# Patient Record
Sex: Female | Born: 2000 | Hispanic: Yes | Marital: Single | Smoking: Never smoker
Health system: Southern US, Community
[De-identification: ages and names within clinical notes are randomized; demographics above are authoritative.]

## PROBLEM LIST (undated history)

## (undated) DIAGNOSIS — J45909 Unspecified asthma, uncomplicated: Secondary | ICD-10-CM

## (undated) DIAGNOSIS — L732 Hidradenitis suppurativa: Secondary | ICD-10-CM

## (undated) HISTORY — PX: NO PAST SURGERIES: SHX2092

---
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
## 2019-02-12 ENCOUNTER — Emergency Department: Payer: Medicaid Other

## 2019-02-12 ENCOUNTER — Other Ambulatory Visit: Payer: Self-pay

## 2019-02-12 ENCOUNTER — Emergency Department
Admission: EM | Admit: 2019-02-12 | Discharge: 2019-02-12 | Disposition: A | Discharge status: Home / Self Care | Payer: Medicaid Other | Attending: Emergency Medicine | Admitting: Emergency Medicine

## 2019-02-12 DIAGNOSIS — R0789 Other chest pain: Secondary | ICD-10-CM | POA: Diagnosis not present

## 2019-02-12 DIAGNOSIS — Z79899 Other long term (current) drug therapy: Secondary | ICD-10-CM | POA: Insufficient documentation

## 2019-02-12 DIAGNOSIS — J45909 Unspecified asthma, uncomplicated: Secondary | ICD-10-CM | POA: Diagnosis not present

## 2019-02-12 LAB — CBC
HCT: 40.7 % (ref 36.0–46.0)
Hemoglobin: 13.9 g/dL (ref 12.0–15.0)
MCH: 28.4 pg (ref 26.0–34.0)
MCHC: 34.2 g/dL (ref 30.0–36.0)
MCV: 83.2 fL (ref 80.0–100.0)
Platelets: 326 10*3/uL (ref 150–400)
RBC: 4.89 MIL/uL (ref 3.87–5.11)
RDW: 12.8 % (ref 11.5–15.5)
WBC: 13 10*3/uL — ABNORMAL HIGH (ref 4.0–10.5)
nRBC: 0 % (ref 0.0–0.2)

## 2019-02-12 LAB — BASIC METABOLIC PANEL
Anion gap: 11 (ref 5–15)
BUN: 12 mg/dL (ref 6–20)
CO2: 23 mmol/L (ref 22–32)
Calcium: 9 mg/dL (ref 8.9–10.3)
Chloride: 104 mmol/L (ref 98–111)
Creatinine, Ser: 0.44 mg/dL (ref 0.44–1.00)
GFR calc Af Amer: >60 mL/min (ref >60)
GFR calc non Af Amer: >60 mL/min (ref >60)
Glucose, Bld: 110 mg/dL — ABNORMAL HIGH (ref 70–99)
Potassium: 3.5 mmol/L (ref 3.5–5.1)
Sodium: 138 mmol/L (ref 135–145)

## 2019-02-12 LAB — TROPONIN I (HIGH SENSITIVITY): Troponin I (High Sensitivity): 3 ng/L (ref <18)

## 2019-02-12 NOTE — Discharge Instructions (Signed)
Return to the ER for new, worsening, or persistent severe chest pain, difficulty breathing, fever, weakness, or any other new or worsening symptoms that concern you.  You should take ibuprofen (Advil, Motrin) 400 or 600 mg every 6 hours for the next several days with food.

## 2019-02-12 NOTE — ED Provider Notes (Signed)
[facility] Emergency Department Provider Note ____________________________________________   First MD Initiated Contact with Patient 02/12/19 [phone]     (approximate)  I have reviewed the triage vital signs and the nursing notes.   HISTORY  Chief Complaint Chest Pain and Headache    HPI Angela Middleton is a 19 y.o. female with PMH as noted below who presents with chest pain, gradual onset over the last week, and more severe in intensity since yesterday.  The patient states that it was worse after she was lifting heavy boxes at work.  The pain is described as pressure-like and it is substernal and nonradiating.  She states it feels better when she scrunches her upper body forward, and worse when she moves her shoulders back.  She denies associated lightheadedness, shortness of breath, or vomiting, although she does report some intermittent nausea in the last few days.  She has had no cough or fever.  She has no leg swelling.  She denies any prior history of this pain.  Past Medical History:  Diagnosis Date  . Asthma     There are no problems to display for this patient.   No past surgical history on file.  Prior to Admission medications   Medication Sig Start Date End Date Taking? Authorizing Provider  albuterol (VENTOLIN HFA) 108 (90 Base) MCG/ACT inhaler Inhale 2 puffs into the lungs every 6 (six) hours as needed for wheezing or shortness of breath. 06/22/18   Coral Spikes, DO  benzonatate (TESSALON) 200 MG capsule Take 1 capsule (200 mg total) by mouth 3 (three) times daily as needed for cough. 06/22/18   Coral Spikes, DO  cetirizine (ZYRTEC) 10 MG tablet Take 10 mg by mouth daily.    [provider]  fluticasone (FLONASE) 50 MCG/ACT nasal spray Place 2 sprays into both nostrils daily. 02/10/18   Lorin Picket, PA-C  predniSONE (DELTASONE) 50 MG tablet 1 tablet daily x 5 days 06/22/18   Coral Spikes, DO    Allergies Patient has no known  allergies.  Family History  Problem Relation Age of Onset  . Asthma Mother   . Healthy Mother   . Diabetes Father   . Healthy Father     Social History Social History   Tobacco Use  . Smoking status: Never Smoker  . Smokeless tobacco: Never Used  Substance Use Topics  . Alcohol use: No  . Drug use: Never    Review of Systems  Constitutional: No fever. Eyes: No redness. ENT: No sore throat. Cardiovascular: Positive for chest pain. Respiratory: Denies shortness of breath. Gastrointestinal: No vomiting or diarrhea.  Genitourinary: Negative for flank pain.  Musculoskeletal: Negative for back pain. Skin: Negative for rash. Neurological: Positive for headache.   ____________________________________________   PHYSICAL EXAM:  VITAL SIGNS: ED Triage Vitals  Enc Vitals Group     BP 02/12/19 0117 130/83     Pulse Rate 02/12/19 0117 96     Resp 02/12/19 0117 18     Temp 02/12/19 0117 98.8 F (37.1 C)     Temp Source 02/12/19 0117 Oral     SpO2 02/12/19 0117 98 %     Weight 02/12/19 0116 185 lb (83.9 kg)     Height 02/12/19 0116 5\' 2"  (1.575 m)     Head Circumference --      Peak Flow --      Pain Score 02/12/19 0116 7     Pain Loc --  Pain Edu? --      Excl. in [place]? --     Constitutional: Alert and oriented. Well appearing and in no acute distress. Eyes: Conjunctivae are normal.  Head: Atraumatic. Nose: No congestion/rhinnorhea. Mouth/Throat: Mucous membranes are moist.   Neck: Normal range of motion.  Cardiovascular: Normal rate, regular rhythm.   Good peripheral circulation. Respiratory: Normal respiratory effort.  No retractions.  Gastrointestinal:  No distention.  Musculoskeletal: No lower extremity edema.  No calf or popliteal swelling.  Extremities warm and well perfused.  Neurologic:  Normal speech and language. No gross focal neurologic deficits are appreciated.  Skin:  Skin is warm and dry. No rash noted. Psychiatric: Mood and affect are normal.  Speech and behavior are normal.  ____________________________________________   LABS (all labs ordered are listed, but only abnormal results are displayed)  Labs Reviewed  BASIC METABOLIC PANEL - Abnormal; Notable for the following components:      Result Value   Glucose, Bld 110 (*)    All other components within normal limits  CBC - Abnormal; Notable for the following components:   WBC 13.0 (*)    All other components within normal limits  POC URINE PREG, ED  TROPONIN I (HIGH SENSITIVITY)  TROPONIN I (HIGH SENSITIVITY)   ____________________________________________  EKG  ED ECG REPORT I, Arta Silence, the attending physician, personally viewed and interpreted this ECG.  Date: 02/12/2019 EKG Time: 0122 Rate: 98 Rhythm: normal sinus rhythm QRS Axis: normal Intervals: normal ST/T Wave abnormalities: normal Narrative Interpretation: no evidence of acute ischemia  ____________________________________________  RADIOLOGY  CXR: No focal infiltrate or other acute abnormality  ____________________________________________   PROCEDURES  Procedure(s) performed: No  Procedures  Critical Care performed: No ____________________________________________   INITIAL IMPRESSION / ASSESSMENT AND PLAN / ED COURSE  Pertinent labs & imaging results that were available during my care of the patient were reviewed by me and considered in my medical decision making (see chart for details).  19 year old female with a history of asthma presents with atypical and nonexertional chest pain over the last week which she states is worse with certain positions and after she was lifting boxes.  She reports some nausea and headache but denies associated shortness of breath or lightheadedness.  She has no leg pain or swelling.  She is not on birth control.  On exam, the patient is well-appearing.  Her vital signs are normal.  The physical exam is unremarkable.  EKG shows no  abnormalities.  Overall presentation is consistent with costochondritis or other musculoskeletal chest wall pain given the positional nature, the worsening of the pain after lifting boxes, and the lack of other significant associated symptoms.  The patient has no risk factors for ACS.  She is PERC negative.  There is no evidence of other vascular etiology.  Lab work-up was obtained from triage and is within normal limits.  The troponin is negative.  Given the duration of the pain as well as the patient's very low risk for ACS, there is no indication for repeat troponin.  At this time, the patient is stable for discharge home.  I counseled her on the results of the work-up and the plan of care.  I advised her to take ibuprofen, and she states she would like to obtain this over-the-counter.  I gave her thorough return precautions and she expressed understanding.  ____________________________________________   FINAL CLINICAL IMPRESSION(S) / ED DIAGNOSES  Final diagnoses:  Atypical chest pain      NEW MEDICATIONS  STARTED DURING THIS VISIT:  New Prescriptions   No medications on file     Note:  This document was prepared using Dragon voice recognition software and may include unintentional dictation errors.   Arta Silence, MD 02/12/19 [phone]

## 2019-02-12 NOTE — ED Notes (Signed)
Pt verbalized understanding of discharge instructions. NAD at this time. 

## 2019-02-12 NOTE — ED Triage Notes (Signed)
Patient reports mid sternal chest pain that does not radiate, described as pressure.   Patient also reports headache all day.

## 2019-08-15 ENCOUNTER — Other Ambulatory Visit: Payer: Self-pay

## 2019-08-15 ENCOUNTER — Encounter: Payer: Self-pay | Admitting: Advanced Practice Midwife

## 2019-08-15 ENCOUNTER — Ambulatory Visit (LOCAL_COMMUNITY_HEALTH_CENTER): Payer: Medicaid Other | Admitting: Advanced Practice Midwife

## 2019-08-15 VITALS — BP 122/64 | Ht 62.0 in | Wt 187.4 lb

## 2019-08-15 DIAGNOSIS — Z3009 Encounter for other general counseling and advice on contraception: Secondary | ICD-10-CM | POA: Diagnosis not present

## 2019-08-15 DIAGNOSIS — Z3200 Encounter for pregnancy test, result unknown: Secondary | ICD-10-CM | POA: Diagnosis not present

## 2019-08-15 LAB — PREGNANCY, URINE: Preg Test, Ur: POSITIVE — AB

## 2019-08-15 MED ORDER — PRENATAL VITAMINS 28-0.8 MG PO TABS: 1.0000 | ORAL_TABLET | Freq: Every day | ORAL | 0 refills | Status: AC

## 2019-08-15 NOTE — Progress Notes (Signed)
+  PT today.  Not seen by provider but RN counseled on pregnancy

## 2019-08-15 NOTE — Progress Notes (Addendum)
PT positive today. Did not see a provider due to +PT. Patient sent up to clerical to schedule new OB appt. Accepted PNV's and +PT packet. Hal Morales, RN

## 2019-08-15 NOTE — Progress Notes (Signed)
Here today for PE and to restart Depo. New patient here. No PE or Pap records. Patient states "she needs a pregnancy test because she took a home pregnancy test and it was positive." RN consulted with E. Sciora, CNM. Patient sent to lab for PT. Hal Morales, RN

## 2019-08-17 ENCOUNTER — Ambulatory Visit
Admission: EM | Admit: 2019-08-17 | Discharge: 2019-08-17 | Disposition: A | Discharge status: Home / Self Care | Payer: Medicaid Other | Attending: Internal Medicine | Admitting: Internal Medicine

## 2019-08-17 ENCOUNTER — Other Ambulatory Visit: Payer: Self-pay

## 2019-08-17 DIAGNOSIS — J020 Streptococcal pharyngitis: Secondary | ICD-10-CM | POA: Insufficient documentation

## 2019-08-17 DIAGNOSIS — O209 Hemorrhage in early pregnancy, unspecified: Secondary | ICD-10-CM | POA: Diagnosis not present

## 2019-08-17 LAB — CHLAMYDIA/NGC RT PCR (ARMC ONLY)
Chlamydia Tr: DETECTED — AB
N gonorrhoeae: NOT DETECTED

## 2019-08-17 LAB — WET PREP, GENITAL
Sperm: NONE SEEN
Trich, Wet Prep: NONE SEEN

## 2019-08-17 LAB — GROUP A STREP BY PCR: Group A Strep by PCR: DETECTED — AB

## 2019-08-17 MED ORDER — AMOXICILLIN 500 MG PO CAPS: 500.0000 mg | ORAL_CAPSULE | Freq: Two times a day (BID) | ORAL | 0 refills | Status: AC

## 2019-08-17 NOTE — ED Provider Notes (Addendum)
[facility]    CSN: [id number] Arrival date & time: 08/17/19  1726      History   Chief Complaint Chief Complaint  Patient presents with  . Sore Throat  . Vaginal Problem    HPI Angela Middleton is a 19 y.o. female currently 6 to [redacted] weeks pregnant comes to the urgent care with painful swelling in the left labia.  Patient says swelling started around July 1.  She initially applied some ointment to it and seemed to have helped.  Over the past couple of days she has noticed increased pain and swelling.  No discharge from it.  Pain is throbbing and of moderate severity.  No fever or chills.  Patient also complains about vaginal spotting with intermittent abdominal pain.  Currently she denies any abdominal pain.  When she has pain is mainly in the lower abdominal region and is crampy in nature.  2 days ago patient started experiencing throat pain.  Pain is currently severe.  Is aggravated by swallowing.  No known relieving factors.  No ear pain or discharge.  No nausea or vomiting.   HPI  Past Medical History:  Diagnosis Date  . Asthma     There are no problems to display for this patient.   History reviewed. No pertinent surgical history.  OB History    Gravida  1   Para      Term      Preterm      AB      Living        SAB      TAB      Ectopic      Multiple      Live Births               Home Medications    Prior to Admission medications   Medication Sig Start Date End Date Taking? Authorizing Provider  albuterol (VENTOLIN HFA) 108 (90 Base) MCG/ACT inhaler Inhale 2 puffs into the lungs every 6 (six) hours as needed for wheezing or shortness of breath. 06/22/18   Coral Spikes, DO  amoxicillin (AMOXIL) 500 MG capsule Take 1 capsule (500 mg total) by mouth 2 (two) times daily for 10 days. 08/17/19 08/27/19  Chase Picket, MD  cetirizine (ZYRTEC) 10 MG tablet Take 10 mg by mouth daily.    [provider]  fluticasone (FLONASE) 50  MCG/ACT nasal spray Place 2 sprays into both nostrils daily. Patient not taking: Reported on 08/15/2019 02/10/18   Lorin Picket, PA-C  Prenatal Vit-Fe Fumarate-FA (PRENATAL VITAMINS) 28-0.8 MG TABS Take 1 tablet by mouth daily for 100 doses. 08/15/19 11/23/19  Herbie Saxon, CNM    Family History Family History  Problem Relation Age of Onset  . Asthma Mother   . Healthy Mother   . Diabetes Father   . Healthy Father     Social History Social History   Tobacco Use  . Smoking status: Former Smoker    Types: Cigarettes  . Smokeless tobacco: Former Systems developer  . Tobacco comment: Vapes occasionally  Substance Use Topics  . Alcohol use: Not Currently  . Drug use: Never     Allergies   Patient has no known allergies.   Review of Systems Review of Systems  Constitutional: Negative for activity change, chills and fatigue.  HENT: Positive for sore throat. Negative for congestion, ear discharge, ear pain and mouth sores.   Eyes: Negative for pain, discharge and itching.  Respiratory: Negative  for cough, chest tightness and shortness of breath.   Cardiovascular: Negative.   Gastrointestinal: Negative for abdominal pain, nausea and vomiting.  Genitourinary: Positive for vaginal pain. Negative for dysuria, frequency, urgency and vaginal discharge.     Physical Exam Triage Vital Signs ED Triage Vitals  Enc Vitals Group     BP 08/17/19 1740 116/73     Pulse Rate 08/17/19 1740 (!) 105     Resp 08/17/19 1740 16     Temp 08/17/19 1740 98.8 F (37.1 C)     Temp Source 08/17/19 1740 Oral     SpO2 08/17/19 1740 100 %     Weight 08/17/19 1741 187 lb (84.8 kg)     Height 08/17/19 1741 5\' 2"  (1.575 m)     Head Circumference --      Peak Flow --      Pain Score 08/17/19 1739 7     Pain Loc --      Pain Edu? --      Excl. in [place]? --    No data found.  Updated Vital Signs BP 116/73 (BP Location: Left Arm)   Pulse (!) 105   Temp 98.8 F (37.1 C) (Oral)   Resp 16   Ht 5\' 2"   (1.575 m)   Wt 84.8 kg   LMP 07/07/2019   SpO2 100%   BMI 34.20 kg/m   Visual Acuity Right Eye Distance:   Left Eye Distance:   Bilateral Distance:    Right Eye Near:   Left Eye Near:    Bilateral Near:     Physical Exam Vitals and nursing note reviewed.  Constitutional:      Appearance: She is well-developed.  HENT:     Right Ear: Tympanic membrane normal. No drainage or swelling.     Left Ear: Tympanic membrane normal. No drainage or swelling.     Nose: No congestion.     Mouth/Throat:     Mouth: Mucous membranes are moist. No oral lesions.     Pharynx: Oropharyngeal exudate present. No pharyngeal swelling or uvula swelling.     Tonsils: Tonsillar exudate present. 1+ on the right. 1+ on the left.  Eyes:     Extraocular Movements:     Right eye: Normal extraocular motion.     Left eye: Normal extraocular motion.  Neck:     Thyroid: No thyromegaly.  Cardiovascular:     Rate and Rhythm: Normal rate and regular rhythm.  Pulmonary:     Effort: Pulmonary effort is normal. No respiratory distress.     Breath sounds: Normal breath sounds. No wheezing or rhonchi.  Abdominal:     General: Bowel sounds are normal. There is no distension.     Palpations: Abdomen is soft.     Tenderness: There is no abdominal tenderness. There is no guarding.  Musculoskeletal:     Cervical back: Normal range of motion.  Lymphadenopathy:     Cervical: No cervical adenopathy.  Neurological:     Mental Status: She is alert.      UC Treatments / Results  Labs (all labs ordered are listed, but only abnormal results are displayed) Labs Reviewed  GROUP A STREP BY PCR - Abnormal; Notable for the following components:      Result Value   Group A Strep by PCR DETECTED (*)    All other components within normal limits  CHLAMYDIA/NGC RT PCR ([facility] ONLY)  WET PREP, GENITAL    EKG   Radiology No results  found.  Procedures Procedures (including critical care time)  Medications  Ordered in UC Medications - No data to display  Initial Impression / Assessment and Plan / UC Course  I have reviewed the triage vital signs and the nursing notes.  Pertinent labs & imaging results that were available during my care of the patient were reviewed by me and considered in my medical decision making (see chart for details).     1.  Acute streptococcal pharyngitis: Strep PCR is positive Warm salt water gargle Amoxicillin 500 mg twice daily for 10 days If patient experiences worsening symptoms she is advised to return to the urgent care  2.  Vaginal bleeding in pregnancy: Patient will follow up with [facility] tomorrow to be evaluated STD screen-GC/chlamydia Wet prep  3.  Furunculosis in the vulvar area: No drainable abscess Sitz baths No surrounding erythema If patient experiences worsening symptoms she is advised to return to the urgent care to be reevaluated.  Final Clinical Impressions(s) / UC Diagnoses   Final diagnoses:  Strep pharyngitis  Vaginal bleeding in pregnancy, first trimester   Discharge Instructions   None    ED Prescriptions    Medication Sig Dispense Auth. Provider   amoxicillin (AMOXIL) 500 MG capsule Take 1 capsule (500 mg total) by mouth 2 (two) times daily for 10 days. 20 capsule Jeferson Boozer, Myrene Galas, MD     PDMP not reviewed this encounter.   Chase Picket, MD 08/17/19 1919    Chase Picket, MD 08/17/19 Lurena Nida

## 2019-08-17 NOTE — ED Triage Notes (Addendum)
Pt states she noticed a bump near her vaginal opening around July 1 and started using ointment. Seemed to be getting better, but now has gotten bigger again. Also has a sore throat starting yesterday but much worse today. Admits to unprotected sex and also just found out she is pregnant. Reports she is 6-weeks pregnant.

## 2019-08-18 ENCOUNTER — Telehealth: Payer: Self-pay

## 2019-08-18 DIAGNOSIS — A749 Chlamydial infection, unspecified: Secondary | ICD-10-CM

## 2019-08-18 DIAGNOSIS — B3731 Acute candidiasis of vulva and vagina: Secondary | ICD-10-CM

## 2019-08-18 MED ORDER — AZITHROMYCIN 250 MG PO TABS: 250.0000 mg | ORAL_TABLET | Freq: Every day | ORAL | 0 refills | Status: DC

## 2019-08-18 MED ORDER — AZITHROMYCIN 250 MG PO TABS: ORAL_TABLET | ORAL | 0 refills | Status: DC

## 2019-08-18 MED ORDER — TERCONAZOLE 0.4 % VA CREA: 1.0000 | TOPICAL_CREAM | Freq: Every day | VAGINAL | 2 refills | Status: DC

## 2019-09-19 ENCOUNTER — Ambulatory Visit: Payer: Medicaid Other

## 2019-09-19 ENCOUNTER — Other Ambulatory Visit: Payer: Self-pay

## 2019-09-19 ENCOUNTER — Ambulatory Visit (LOCAL_COMMUNITY_HEALTH_CENTER): Payer: Medicaid Other | Admitting: Family Medicine

## 2019-09-19 ENCOUNTER — Encounter: Payer: Self-pay | Admitting: Family Medicine

## 2019-09-19 VITALS — BP 139/80 | Ht 62.0 in | Wt 188.0 lb

## 2019-09-19 DIAGNOSIS — Z113 Encounter for screening for infections with a predominantly sexual mode of transmission: Secondary | ICD-10-CM

## 2019-09-19 DIAGNOSIS — Z3009 Encounter for other general counseling and advice on contraception: Secondary | ICD-10-CM | POA: Diagnosis not present

## 2019-09-19 LAB — PREGNANCY, URINE: Preg Test, Ur: NEGATIVE

## 2019-09-19 LAB — WET PREP FOR TRICH, YEAST, CLUE
Trichomonas Exam: NEGATIVE
Yeast Exam: NEGATIVE

## 2019-09-19 MED ORDER — THERA VITAL M PO TABS: 1.0000 | ORAL_TABLET | Freq: Every day | ORAL | 0 refills | Status: DC

## 2019-09-19 NOTE — Progress Notes (Signed)
[facility] Main Number: [phone]    Family Planning Visit- Initial Visit  Subjective:  Angela Middleton is a 19 y.o.  [id number]   being seen today for an initial well woman visit and to discuss family planning options.  She is currently using None for pregnancy prevention. Patient reports she does not want a pregnancy in the next year.  Patient has the following medical conditions does not have a problem list on file.  Chief Complaint  Patient presents with  . Contraception    Physical and birth control    Patient reports she is here for birth control and exam.  States that she had a miscarriage 08/18/2019 and was treated for chlamydia and yeast.  She and partner have unprotected sex within the week after miscarriage- States she didn't receive counseling when to resume sex.    Patient denies other concerns.  Body mass index is 34.39 kg/m. - Patient is eligible for diabetes screening based on BMI and age >99?  not applicable JT7S ordered? not applicable  Patient reports 1 of partners in last year. Desires STI screening?  Yes  Has patient been screened once for HCV in the past?  No  No results found for: HCVAB  Does the patient have current drug use (including MJ), have a partner with drug use, and/or has been incarcerated since last result? No  If yes-- Screen for HCV through [facility]   Does the patient meet criteria for HBV testing? No  Criteria:  -Household, sexual or needle sharing contact with HBV -History of drug use -HIV positive -Those with known Hep C   Health Maintenance Due  Topic Date Due  . Hepatitis C Screening  Never done  . COVID-19 Vaccine (1) Never done  . CHLAMYDIA SCREENING  Never done  . HIV Screening  Never done  . TETANUS/TDAP  Never done  . INFLUENZA VACCINE  09/10/2019    Review of Systems  Genitourinary:       Vaginal discharge- treated 08/18/2019 for chlamydia  and yeast- would like to have TOC today.  All other systems reviewed and are negative.   The following portions of the patient's history were reviewed and updated as appropriate: allergies, current medications, past family history, past medical history, past social history, past surgical history and problem list. Problem list updated.   See flowsheet for other program required questions.  Objective:   Vitals:   09/19/19 1015  BP: 139/80  Weight: 188 lb (85.3 kg)  Height: 5\' 2"  (1.575 m)    Physical Exam Vitals and nursing note reviewed.  Constitutional:      Appearance: Normal appearance.  HENT:     Head: Normocephalic.  Pulmonary:     Effort: Pulmonary effort is normal.  Genitourinary:    General: Normal vulva.     Exam position: Lithotomy position.     Pubic Area: No rash.      Labia:        Right: No rash, tenderness, lesion or injury.        Left: No rash, tenderness, lesion or injury.      Vagina: No vaginal discharge.     Cervix: Normal.     Uterus: Normal.      Adnexa: Right adnexa normal and left adnexa normal.     Rectum: Normal.  Musculoskeletal:        General: Normal range of motion.  Lymphadenopathy:  Lower Body: No right inguinal adenopathy. No left inguinal adenopathy.  Skin:    General: Skin is warm and dry.  Neurological:     Mental Status: She is alert. Mental status is at baseline.       Assessment and Plan:  Angela Middleton is a 19 y.o. female presenting to the [facility] Department for an initial well woman exam/family planning visit  Contraception counseling: Reviewed all forms of birth control options in the tiered based approach. available including abstinence; over the counter/barrier methods; hormonal contraceptive medication including pill, patch, ring, injection,contraceptive implant, ECP; hormonal and nonhormonal IUDs; permanent sterilization options including vasectomy and the various tubal sterilization modalities.  Risks, benefits, and typical effectiveness rates were reviewed.  Questions were answered.  Written information was also given to the patient to review.  Patient desires to use condoms x 2 weeks, this was prescribed for patient. She will follow up in 2 weeks for PT and OCPs if PT negative for surveillance.  She was told to call with any further questions, or with any concerns about this method of contraception.  Emphasized use of condoms 100% of the time for STI prevention.  Patient wasn't an ECP candidate  1. Family planning - Counseled client that the PT today wouldn't give testing for the recent 14 days that she and partner had unprotected sex. Consulted with Dr. Ernestina Patches-  Discussed with client that if PT wasvnegative today client could start either the Depo, take Micronor or use condoms until she came back for her PT in 2 weeks.  IF negative and no unprotected sex then OCP's would be prescribed if she desired to change to this method.  Client chose to use condoms for 2 weeks then return for PT and OCP's.  - Pregnancy, urine - Multiple Vitamins-Minerals (MULTIVITAMIN) tablet;  Take 1 tablet by mouth daily.  Dispense: 100 tablet; Refill: 0  2. Screening examination for venereal disease  - WET PREP FOR [place], YEAST, CLUE - Chlamydia/Gonorrhea [place] [facility] - HIV [place] [facility] - Syphilis Serology, [place] [facility]     Return in 17 days (on 10/06/2019) for UPT and start birth control pills, 2-3 weeks for TR's, annual and PRN.  Future Appointments  Date Time Provider [place]  10/06/2019 10:40 AM [facility] PROVIDER [facility] None    Hassell Done, FNP

## 2019-09-19 NOTE — Progress Notes (Signed)
UPT is negative today. Wet mount reviewed and is negative today. Pt received MVI's per pt request and per standing order. Pt also received condoms per pt request. Per Hassell Done, FNP pt plans to use condoms 100% of the time for the next 2 weeks and then RTC for a UPT and birth control pills. Pt states she does plan to use condoms 100% of the time and pt scheduled for UPT and to start birth control pills 10/06/2019 at 10:40am per pt request and per provider verbal order. Pt aware of appt date and time and to arrive early for check-in and appt card given to pt. Provider orders completed.

## 2019-09-19 NOTE — Progress Notes (Signed)
Pt here for physical and birth control. Pt's LMP was 07/07/2019. Pt reports that she had a miscarriage ~08/18/2019. Pt reports she went to an OBGYN on [address] 08/18/2019 where they did an ultrasound. Pt reports she had bleeding for about 4 days. Pt reports last sex was 09/18/2019 without condom. Within the last 2 weeks pt reports that she and partner has had sex without condom multiple times. Pt reports she is having concerns about pain and some bleeding after sex. Pt reports was treated for Chlamydia 08/2019. Pt desires STD screening.

## 2019-10-06 ENCOUNTER — Ambulatory Visit: Payer: Self-pay

## 2019-12-14 ENCOUNTER — Encounter: Payer: Self-pay | Admitting: Emergency Medicine

## 2019-12-14 ENCOUNTER — Other Ambulatory Visit: Payer: Self-pay

## 2019-12-14 ENCOUNTER — Ambulatory Visit
Admission: EM | Admit: 2019-12-14 | Discharge: 2019-12-14 | Disposition: A | Discharge status: Home / Self Care | Payer: Medicaid Other | Attending: Physician Assistant | Admitting: Physician Assistant

## 2019-12-14 DIAGNOSIS — R102 Pelvic and perineal pain: Secondary | ICD-10-CM | POA: Diagnosis not present

## 2019-12-14 DIAGNOSIS — L708 Other acne: Secondary | ICD-10-CM | POA: Insufficient documentation

## 2019-12-14 LAB — URINALYSIS, COMPLETE (UACMP) WITH MICROSCOPIC
Bilirubin Urine: NEGATIVE
Glucose, UA: NEGATIVE mg/dL
Hgb urine dipstick: NEGATIVE
Ketones, ur: 15 mg/dL — AB
Leukocytes,Ua: NEGATIVE
Nitrite: NEGATIVE
Protein, ur: NEGATIVE mg/dL
RBC / HPF: NONE SEEN RBC/hpf (ref 0–5)
Specific Gravity, Urine: 1.025 (ref 1.005–1.030)
pH: 7 (ref 5.0–8.0)

## 2019-12-14 LAB — WET PREP, GENITAL
Clue Cells Wet Prep HPF POC: NONE SEEN
Sperm: NONE SEEN
Trich, Wet Prep: NONE SEEN
Yeast Wet Prep HPF POC: NONE SEEN

## 2019-12-14 LAB — PREGNANCY, URINE: Preg Test, Ur: NEGATIVE

## 2019-12-14 MED ORDER — BENZOYL PEROXIDE-ERYTHROMYCIN 5-3 % EX GEL
Freq: Two times a day (BID) | CUTANEOUS | 0 refills | Status: DC
Start: 2019-12-14 — End: 2019-12-20

## 2019-12-14 NOTE — ED Triage Notes (Signed)
Pt c/o vaginal pain, and discomfort. Started about a week ago. She wants std testing. Denies vaginal itching, discharge or dysuria.

## 2019-12-14 NOTE — Discharge Instructions (Addendum)
If the STD tests are negative, then use hydrocortisone cream lightly on areas of discomfort twice a day for 7 days. It re-occurs come right away to look at the area in case we need to re-culture for herpes.

## 2019-12-14 NOTE — ED Provider Notes (Signed)
[facility]    CSN: [id number] Arrival date & time: 12/14/19  1050      History   Chief Complaint Chief Complaint  Patient presents with  . Vaginal Pain    HPI Angela Middleton is a 19 y.o. female who presents with vaginal pain x 1 week. She experiences pain in the intravaginal area after sex. Has been with same partner x 6 months. Denies lesions or abnormal discharge. LMP 6 weeks ago, but takes continues OC's. Has been takingh OC's x 3 months.   2- gets black heads on the border of her lips and when she picks it turns into sores and scars up. Was sent to Derm by her PCP but they cant see her til April and her medicaid card will ran out.       Past Medical History:  Diagnosis Date  . Asthma     There are no problems to display for this patient.   History reviewed. No pertinent surgical history.  OB History    Gravida  2   Para      Term      Preterm      AB  1   Living        SAB  1   TAB      Ectopic      Multiple      Live Births               Home Medications    Prior to Admission medications   Medication Sig Start Date End Date Taking? Authorizing Provider  [place] 28 0.25-35 MG-MCG tablet Take 1 tablet by mouth daily. 10/05/19  Yes [provider]  albuterol (VENTOLIN HFA) 108 (90 Base) MCG/ACT inhaler Inhale 2 puffs into the lungs every 6 (six) hours as needed for wheezing or shortness of breath. 06/22/18   Coral Spikes, DO  benzoyl peroxide-erythromycin [facility]) gel Apply topically 2 (two) times daily. 12/14/19   Rodriguez-Southworth, Sunday Spillers, PA-C  cetirizine (ZYRTEC) 10 MG tablet Take 10 mg by mouth daily.  12/14/19  [provider]  fluticasone (FLONASE) 50 MCG/ACT nasal spray Place 2 sprays into both nostrils daily. 02/10/18 12/14/19  Lorin Picket, PA-C    Family History Family History  Problem Relation Age of Onset  . Asthma Mother   . Healthy Mother   . Diabetes Father   . Healthy Father     . Hypertension Father   . High Cholesterol Father   . Diabetes Paternal Grandfather   . Hypertension Paternal Grandfather   . Diabetes Paternal Grandmother   . Hypertension Paternal Grandmother   . Diabetes Maternal Uncle   . Hypertension Maternal Uncle     Social History Social History   Tobacco Use  . Smoking status: Former Smoker    Types: Cigarettes  . Smokeless tobacco: Former Systems developer  . Tobacco comment: no longer vaping  Vaping Use  . Vaping Use: Every day  Substance Use Topics  . Alcohol use: Not Currently  . Drug use: Never     Allergies   Shrimp [shellfish allergy]   Review of Systems Review of Systems  Constitutional: Negative for fever.  Gastrointestinal: Negative for abdominal pain.  Genitourinary: Positive for vaginal pain. Negative for difficulty urinating, dysuria, frequency, menstrual problem, pelvic pain and vaginal discharge.  Musculoskeletal: Negative for gait problem.  Skin: Negative for rash and wound.       Hx of frequent abscesses  Has black heads on border of lips  Physical Exam Triage Vital Signs ED Triage Vitals  Enc Vitals Group     BP 12/14/19 1132 106/73     Pulse Rate 12/14/19 1132 93     Resp 12/14/19 1132 18     Temp 12/14/19 1132 98.9 F (37.2 C)     Temp Source 12/14/19 1132 Oral     SpO2 12/14/19 1132 99 %     Weight 12/14/19 1128 188 lb 0.8 oz (85.3 kg)     Height 12/14/19 1128 5\' 2"  (1.575 m)     Head Circumference --      Peak Flow --      Pain Score 12/14/19 1128 5     Pain Loc --      Pain Edu? --      Excl. in [place]? --    No data found.  Updated Vital Signs BP 106/73 (BP Location: Left Arm)   Pulse 93   Temp 98.9 F (37.2 C) (Oral)   Resp 18   Ht 5\' 2"  (1.575 m)   Wt 188 lb 0.8 oz (85.3 kg)   SpO2 99%   Breastfeeding Unknown   BMI 34.40 kg/m   Visual Acuity Right Eye Distance:   Left Eye Distance:   Bilateral Distance:    Right Eye Near:   Left Eye Near:    Bilateral Near:     Physical  Exam Vitals and nursing note reviewed.  Constitutional:      General: She is not in acute distress.    Appearance: She is obese. She is not toxic-appearing.  HENT:     Head: Normocephalic.     Right Ear: External ear normal.     Left Ear: External ear normal.  Eyes:     General: No scleral icterus.    Conjunctiva/sclera: Conjunctivae normal.  Pulmonary:     Effort: Pulmonary effort is normal.  Genitourinary:    General: Normal vulva.     Comments: R inner labia minora with mild erythema, but no lesions present. Herpes culture obtained from this area that is tender.  Musculoskeletal:        General: Normal range of motion.     Cervical back: Neck supple.  Skin:    General: Skin is warm and dry.     Findings: No bruising, lesion or rash.     Comments: Skin above lips border has a few black heads and scarring on the mid upper lip is present.   Neurological:     Mental Status: She is alert and oriented to person, place, and time.     Gait: Gait normal.  Psychiatric:        Mood and Affect: Mood normal.        Behavior: Behavior normal.        Thought Content: Thought content normal.        Judgment: Judgment normal.      UC Treatments / Results  Labs (all labs ordered are listed, but only abnormal results are displayed) Labs Reviewed  WET PREP, GENITAL - Abnormal; Notable for the following components:      Result Value   WBC, Wet Prep HPF POC FEW (*)    All other components within normal limits  URINALYSIS, COMPLETE (UACMP) WITH MICROSCOPIC - Abnormal; Notable for the following components:   Ketones, ur 15 (*)    Bacteria, UA FEW (*)    All other components within normal limits  CHLAMYDIA/NGC RT PCR ([facility] ONLY)  HSV CULTURE AND TYPING  PREGNANCY, URINE    EKG   Radiology No results found.  Procedures Procedures (including critical care time)  Medications Ordered in UC Medications - No data to display  Initial Impression / Assessment and Plan / UC  Course  I have reviewed the triage vital signs and the nursing notes. Has inner vaginal irritation of unknown cause. She may have had herpes and now is healing, or just irritation from sexual friction.  20 minutes spent answering multiple questions she had about her current symptoms and hx of abscesses.  I placed her on Benzamycin gel to apply with a Q-tip on the skin where she gets the black heads bid. We will inform her when the STD tests are back.  Pertinent labs results that were available during my care of the patient were reviewed by me and considered in my medical decision making (see chart for details).    Final Clinical Impressions(s) / UC Diagnoses   Final diagnoses:  Vaginal pain  Other acne     Discharge Instructions     If the STD tests are negative, then use hydrocortisone cream lightly on areas of discomfort twice a day for 7 days. It re-occurs come right away to look at the area in case we need to re-culture for herpes.     ED Prescriptions    Medication Sig Dispense Auth. Provider   benzoyl peroxide-erythromycin (BENZAMYCIN) gel Apply topically 2 (two) times daily. 23.3 g Rodriguez-Southworth, Sunday Spillers, PA-C     PDMP not reviewed this encounter.   Shelby Mattocks, PA-C 12/14/19 1507

## 2019-12-15 LAB — CHLAMYDIA/NGC RT PCR (ARMC ONLY)
Chlamydia Tr: NOT DETECTED
N gonorrhoeae: NOT DETECTED

## 2019-12-17 LAB — HSV CULTURE AND TYPING

## 2019-12-20 ENCOUNTER — Telehealth (HOSPITAL_COMMUNITY): Payer: Self-pay | Admitting: Internal Medicine

## 2019-12-20 MED ORDER — BENZOYL PEROXIDE-ERYTHROMYCIN 5-3 % EX GEL
Freq: Two times a day (BID) | CUTANEOUS | 2 refills | Status: DC
Start: 2019-12-20 — End: 2020-03-06

## 2019-12-20 NOTE — Telephone Encounter (Signed)
As I spoke with her about her Herpes test results, since her medicaid will ran out this month, asked if I could give her refills on the Benzymycin since it is working well. So I did.

## 2020-01-08 ENCOUNTER — Ambulatory Visit
Admission: EM | Admit: 2020-01-08 | Discharge: 2020-01-08 | Disposition: A | Discharge status: Home / Self Care | Payer: Medicaid Other | Attending: Emergency Medicine | Admitting: Emergency Medicine

## 2020-01-08 ENCOUNTER — Other Ambulatory Visit: Payer: Self-pay

## 2020-01-08 DIAGNOSIS — L0291 Cutaneous abscess, unspecified: Secondary | ICD-10-CM | POA: Diagnosis not present

## 2020-01-08 MED ORDER — IBUPROFEN 600 MG PO TABS: 600.0000 mg | ORAL_TABLET | Freq: Four times a day (QID) | ORAL | 0 refills | Status: DC | PRN

## 2020-01-08 MED ORDER — DOXYCYCLINE HYCLATE 100 MG PO CAPS: 100.0000 mg | ORAL_CAPSULE | Freq: Two times a day (BID) | ORAL | 0 refills | Status: AC

## 2020-01-08 NOTE — ED Triage Notes (Signed)
Patient states that she noticed a abscess like area on her clitoris. States that she has had them on her vagina before but never on this specific area. Patient states that area worsened overnight. States that she is due to see her gyn for this 12/14 . Reports that area is extemely painful and has not drained.

## 2020-01-08 NOTE — ED Provider Notes (Signed)
HPI  SUBJECTIVE:  Angela Middleton is a 19 y.o. female who presents with left sided painful mass next to her clitoris starting yesterday.  States that it got bigger today.  She denies labial swelling, urinary complaints, rash, blisters, vaginal odor, vaginal discharge.  She denies increase in her sexual activity.  No fevers, pelvic pain.  She is in a long-term monogamous relationship with a female who is asymptomatic.  Recent STD testing was negative.  STDs are not a concern today.  She states that she had similar symptoms like this before earlier this month, but states that the pain was in a different area on this visit.  She tried Bactroban multiple times yesterday without improvement in her symptoms.  Symptoms are worse with sitting and walking.  She has a past medical history of chlamydia, yeast infections, abscesses.  No history of MRSA, diabetes, other STDs, BV, Bartholin's cyst.  PMD: Princella Ion.  OB/GYN: She is establishing care on 12/14 for this issue.  Patient  was seen here on 11/4 for similar persentation ,, right inner labia minora erythematous, no lesions were present.  Herpes culture was, wet prep, gonorrhea chlamydia negative.  Past Medical History:  Diagnosis Date  . Asthma     History reviewed. No pertinent surgical history.  Family History  Problem Relation Age of Onset  . Asthma Mother   . Healthy Mother   . Diabetes Father   . Healthy Father   . Hypertension Father   . High Cholesterol Father   . Diabetes Paternal Grandfather   . Hypertension Paternal Grandfather   . Diabetes Paternal Grandmother   . Hypertension Paternal Grandmother   . Diabetes Maternal Uncle   . Hypertension Maternal Uncle     Social History   Tobacco Use  . Smoking status: Former Smoker    Types: Cigarettes  . Smokeless tobacco: Former Network engineer  . Vaping Use: Every day  Substance Use Topics  . Alcohol use: Not Currently  . Drug use: Never    No current facility-administered  medications for this encounter.  Current Outpatient Medications:  .  albuterol (VENTOLIN HFA) 108 (90 Base) MCG/ACT inhaler, Inhale 2 puffs into the lungs every 6 (six) hours as needed for wheezing or shortness of breath., Disp: 18 g, Rfl: 3 .  benzoyl peroxide-erythromycin (BENZAMYCIN) gel, Apply topically 2 (two) times daily., Disp: 46.6 g, Rfl: 2 .  SPRINTEC 28 0.25-35 MG-MCG tablet, Take 1 tablet by mouth daily., Disp: , Rfl:  .  doxycycline (VIBRAMYCIN) 100 MG capsule, Take 1 capsule (100 mg total) by mouth 2 (two) times daily for 5 days., Disp: 10 capsule, Rfl: 0 .  ibuprofen (ADVIL) 600 MG tablet, Take 1 tablet (600 mg total) by mouth every 6 (six) hours as needed., Disp: 30 tablet, Rfl: 0  Allergies  Allergen Reactions  . Shrimp [Shellfish Allergy] Nausea And Vomiting     ROS  As noted in HPI.   Physical Exam  BP 116/81 (BP Location: Left Arm)   Pulse 100   Temp 98.5 F (36.9 C) (Oral)   Resp 18   Ht 5\' 2"  (1.575 m)   Wt 88.5 kg   SpO2 100%   BMI 35.67 kg/m   Constitutional: Well developed, well nourished, no acute distress Eyes:  EOMI, conjunctiva normal bilaterally HENT: Normocephalic, atraumatic,mucus membranes moist Respiratory: Normal inspiratory effort Cardiovascular: Normal rate GI: nondistended skin: No rash, skin intact Musculoskeletal: no deformities GU: Small tender pustule on right upper inner labia.  No other vaginal discharge.  No other vaginal lesions.  No other labial swelling.  Chaperone present during exam.    Lymph: No inguinal lymphadenopathy Neurologic: Alert & oriented x 3, no focal neuro deficits Psychiatric: Speech and behavior appropriate   ED Course   Medications - No data to display  No orders of the defined types were placed in this encounter.   No results found for this or any previous visit (from the past 24 hour(s)). No results found.  ED Clinical Impression  1. Abscess      ED Assessment/Plan  Seen here for  vaginal pain on 11/4.  As noted in HPI.  STD testing was negative on last visit.  Will not repeat as she is in a monogamous relationship.  This appears to be a pustule/very small abscess, rather than ulceration suggestive of herpes, or a lesion/wart consistent with syphilis or HPV, so will send home with doxycycline, warm compresses.  I expect this to drain on its own.  Follow-up with GYN if it gets worse.  Discussed  MDM, treatment plan, and plan for follow-up with patient.  patient agrees with plan.   Meds ordered this encounter  Medications  . doxycycline (VIBRAMYCIN) 100 MG capsule    Sig: Take 1 capsule (100 mg total) by mouth 2 (two) times daily for 5 days.    Dispense:  10 capsule    Refill:  0  . ibuprofen (ADVIL) 600 MG tablet    Sig: Take 1 tablet (600 mg total) by mouth every 6 (six) hours as needed.    Dispense:  30 tablet    Refill:  0    *This clinic note was created using [employer]. Therefore, there may be occasional mistakes despite careful proofreading.   ?    Melynda Ripple, MD 01/09/20 1248

## 2020-01-08 NOTE — Discharge Instructions (Addendum)
Warm compresses 3-4 times a day.  May take the ibuprofen with 1000 mg of Tylenol 3-4 times a day as needed for pain.  Finish the antibiotics, if you feel better.  I expect that this will drain on its own and get better after it starts draining.

## 2020-02-12 ENCOUNTER — Other Ambulatory Visit: Payer: Self-pay

## 2020-02-12 ENCOUNTER — Ambulatory Visit
Admission: EM | Admit: 2020-02-12 | Discharge: 2020-02-12 | Disposition: A | Discharge status: Home / Self Care | Payer: Medicaid Other | Attending: Sports Medicine | Admitting: Sports Medicine

## 2020-02-12 DIAGNOSIS — J3489 Other specified disorders of nose and nasal sinuses: Secondary | ICD-10-CM | POA: Diagnosis not present

## 2020-02-12 DIAGNOSIS — J029 Acute pharyngitis, unspecified: Secondary | ICD-10-CM

## 2020-02-12 DIAGNOSIS — R0981 Nasal congestion: Secondary | ICD-10-CM

## 2020-02-12 DIAGNOSIS — Z20822 Contact with and (suspected) exposure to covid-19: Secondary | ICD-10-CM | POA: Diagnosis not present

## 2020-02-12 NOTE — ED Triage Notes (Addendum)
Pt with sx starting yesterday. Headache, itchy throat, and "one area of my nose feels congested." At the end of triage, pt asks to be seen for some abscesses in her axillas that have been there for a few weeks

## 2020-02-12 NOTE — Discharge Instructions (Addendum)
Patient was seen in the urgent care today.  Patient was tested for influenza and Covid.  Test results are pending.  Will take 24 to 48 hours to return from the lab.  Patient should quarantine until test results are available.  If negative, can return to [facility] with the mass.  If positive, should continue quarantine for at least 5 days.  Needs to stay in quarantine until asymptomatic and no fever.  If so can return to work with the mask on day 6.  Use over-the-counter meds as needed including Tylenol Motrin for fever discomfort over-the-counter cough medicine for any cough.  Mucinex for any sinus congestion.  No antibiotics are indicated at this time.  For your hydradenitis suppurativa you should follow back up with OB/GYN.

## 2020-02-12 NOTE — ED Provider Notes (Signed)
[facility]    CSN: [id number] Arrival date & time: 02/12/20  1240      History   Chief Complaint Chief Complaint  Patient presents with  . Headache  . Itchy throat  . Nasal Congestion    HPI Angela Middleton is a 20 y.o. female.   Pleasant 20 year old female who presents for evaluation of 2 days of sinus pressure and an itchy scratchy throat.  She denies chest pain or shortness of breath.  No ear pain.  No nausea vomiting or diarrhea.  No significant fever shakes or chills.  No abdominal pain or urinary symptoms.  She works for [employer] makes her own hours and does not need a work note.  Also followed by Dr. 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  Recently seen just 3 weeks ago and given a medication which she said she could not afford.  She just been using over-the-counter benzyl peroxide.  She was fully educated on her condition and what can be done for it.  This is a chronic condition.  No red flag signs or symptoms     Past Medical History:  Diagnosis Date  . Asthma     There are no problems to display for this patient.   History reviewed. No pertinent surgical history.  OB History    Gravida  2   Para      Term      Preterm      AB  1   Living        SAB  1   IAB      Ectopic      Multiple      Live Births               Home Medications    Prior to Admission medications   Medication Sig Start Date End Date Taking? Authorizing Provider  drospirenone-ethinyl estradiol (YAZ) 3-0.02 MG tablet Take 1 tablet by mouth daily. 01/23/20  Yes [provider]  albuterol (VENTOLIN HFA) 108 (90 Base) MCG/ACT inhaler Inhale 2 puffs into the lungs every 6 (six) hours as needed for wheezing or shortness of breath. 06/22/18   Tommie Sams, DO  benzoyl peroxide-erythromycin [place]) gel Apply topically 2 (two) times daily. 12/20/19   Rodriguez-Southworth, Nettie Elm, PA-C  ibuprofen (ADVIL) 600 MG tablet Take 1  tablet (600 mg total) by mouth every 6 (six) hours as needed. 01/08/20   Domenick Gong, MD  SPRINTEC 28 0.25-35 MG-MCG tablet Take 1 tablet by mouth daily. 10/05/19   [provider]  cetirizine (ZYRTEC) 10 MG tablet Take 10 mg by mouth daily.  12/14/19  [provider]  fluticasone (FLONASE) 50 MCG/ACT nasal spray Place 2 sprays into both nostrils daily. 02/10/18 12/14/19  Lutricia Feil, PA-C    Family History Family History  Problem Relation Age of Onset  . Asthma Mother   . Healthy Mother   . Diabetes Father   . Healthy Father   . Hypertension Father   . High Cholesterol Father   . Diabetes Paternal Grandfather   . Hypertension Paternal Grandfather   . Diabetes Paternal Grandmother   . Hypertension Paternal Grandmother   . Diabetes Maternal Uncle   . Hypertension Maternal Uncle     Social History Social History   Tobacco Use  . Smoking status: Former Smoker    Types: Cigarettes  . Smokeless tobacco: Former Clinical biochemist  . Vaping Use: Every day  Substance Use Topics  .  Alcohol use: Not Currently  . Drug use: Never     Allergies   Shrimp [shellfish allergy]   Review of Systems Review of Systems  Constitutional: Negative for activity change, appetite change, chills, diaphoresis, fatigue and fever.  HENT: Positive for sinus pressure and sore throat. Negative for congestion, ear pain, postnasal drip, rhinorrhea and sneezing.   Eyes: Negative for pain.  Respiratory: Negative for cough, choking, chest tightness, shortness of breath, wheezing and stridor.   Cardiovascular: Negative for chest pain and palpitations.  Gastrointestinal: Negative for abdominal pain, diarrhea, nausea and vomiting.  Genitourinary: Negative for flank pain.  Musculoskeletal: Negative for myalgias.  Skin: Negative for color change, pallor and rash.  Neurological: Negative for dizziness, tremors, numbness and headaches.  All other systems reviewed and are  negative.    Physical Exam Triage Vital Signs ED Triage Vitals  Enc Vitals Group     BP 02/12/20 1418 124/80     Pulse Rate 02/12/20 1418 91     Resp 02/12/20 1418 18     Temp 02/12/20 1418 98.2 F (36.8 C)     Temp Source 02/12/20 1418 Oral     SpO2 02/12/20 1418 100 %     Weight 02/12/20 1315 190 lb (86.2 kg)     Height 02/12/20 1315 5\' 1"  (1.549 m)     Head Circumference --      Peak Flow --      Pain Score 02/12/20 1314 1     Pain Loc --      Pain Edu? --      Excl. in [place]? --    No data found.  Updated Vital Signs BP 124/80 (BP Location: Left Arm)   Pulse 91   Temp 98.2 F (36.8 C) (Oral)   Resp 18   Ht 5\' 1"  (1.549 m)   Wt 86.2 kg   LMP 01/22/2020   SpO2 100%   BMI 35.90 kg/m   Visual Acuity Right Eye Distance:   Left Eye Distance:   Bilateral Distance:    Right Eye Near:   Left Eye Near:    Bilateral Near:     Physical Exam   UC Treatments / Results  Labs (all labs ordered are listed, but only abnormal results are displayed) Labs Reviewed  SARS CORONAVIRUS 2 (TAT 6-24 HRS)    EKG   Radiology No results found.  Procedures Procedures (including critical care time)  Medications Ordered in UC Medications - No data to display  Initial Impression / Assessment and Plan / UC Course  I have reviewed the triage vital signs and the nursing notes.  Pertinent labs & imaging results that were available during my care of the patient were reviewed by me and considered in my medical decision making (see chart for details).  Clinical impression 2 days of sinus pressure with an itchy scratchy throat.  Vital signs are reassuring.  Physical exam is also reassuring.  Patient is also asking about her hydradenitis suppurativa that is followed by [facility] OB[facility].  Treatment plan: 1.  The findings and treatment plan were discussed in detail with the patient.  Patient was in agreement. 2.  I recommended getting a Covid and influenza test.  Did indicate  that the rapid test were out of stock and that we needed to send it to the hospital.  It would take 24 to 48 hours for her results to be available.  Someone will call her from the office if she is positive.  Otherwise she should check on my chart after 24 hours to ensure that she is negative.  If she is negative she can return to work on Wednesday January 5.  She does not need a note as she does her own hours at [employer].  She should use a mask at all times.  If she is positive, she should continue her quarantine past the 24 to 48 hours and continue it for 5 days and if asymptomatic can return to work on day 6.  This was explained to her on discharge and put in the discharge instructions. 3.  Supportive care, plenty of fluids plenty of rest, Tylenol or Motrin for fever or discomfort.  Over-the-counter meds as needed. 4.  Regarding her hydradenitis suppurativa she will follow up with OB/GYN as there is nothing that we would do any further here in the urgent care setting today.  Our focus was on her respiratory symptoms and ruling out Covid and influenza. 5.  Follow-up here as needed.      Final Clinical Impressions(s) / UC Diagnoses   Final diagnoses:  Sinus pressure  Nasal congestion  Sore throat     Discharge Instructions     Patient was seen in the urgent care today.  Patient was tested for influenza and Covid.  Test results are pending.  Will take 24 to 48 hours to return from the lab.  Patient should quarantine until test results are available.  If negative, can return to [facility] with the mass.  If positive, should continue quarantine for at least 5 days.  Needs to stay in quarantine until asymptomatic and no fever.  If so can return to work with the mask on day 6.  Use over-the-counter meds as needed including Tylenol Motrin for fever discomfort over-the-counter cough medicine for any cough.  Mucinex for any sinus congestion.  No antibiotics are indicated at this time.  For your  hydradenitis suppurativa you should follow back up with OB/GYN.    ED Prescriptions    None     PDMP not reviewed this encounter.   Verda Cumins, MD 02/12/20 [phone]

## 2020-02-13 LAB — SARS CORONAVIRUS 2 (TAT 6-24 HRS): SARS Coronavirus 2: NEGATIVE

## 2020-02-17 ENCOUNTER — Other Ambulatory Visit: Payer: Medicaid Other

## 2020-02-17 ENCOUNTER — Other Ambulatory Visit: Payer: Self-pay

## 2020-02-17 DIAGNOSIS — Z20822 Contact with and (suspected) exposure to covid-19: Secondary | ICD-10-CM

## 2020-02-20 LAB — NOVEL CORONAVIRUS, NAA: SARS-CoV-2, NAA: DETECTED — AB

## 2020-03-06 ENCOUNTER — Other Ambulatory Visit: Payer: Self-pay

## 2020-03-06 ENCOUNTER — Ambulatory Visit
Admission: RE | Admit: 2020-03-06 | Discharge: 2020-03-06 | Disposition: A | Discharge status: Home / Self Care | Payer: Medicaid Other | Source: Ambulatory Visit | Attending: Family | Admitting: Family

## 2020-03-06 VITALS — BP 113/77 | HR 100 | Temp 98.3°F | Resp 18 | Ht 61.0 in | Wt 190.0 lb

## 2020-03-06 DIAGNOSIS — J4541 Moderate persistent asthma with (acute) exacerbation: Secondary | ICD-10-CM

## 2020-03-06 DIAGNOSIS — J0100 Acute maxillary sinusitis, unspecified: Secondary | ICD-10-CM

## 2020-03-06 DIAGNOSIS — N939 Abnormal uterine and vaginal bleeding, unspecified: Secondary | ICD-10-CM

## 2020-03-06 DIAGNOSIS — J40 Bronchitis, not specified as acute or chronic: Secondary | ICD-10-CM

## 2020-03-06 MED ORDER — ALBUTEROL SULFATE (2.5 MG/3ML) 0.083% IN NEBU: 2.5000 mg | INHALATION_SOLUTION | Freq: Four times a day (QID) | RESPIRATORY_TRACT | 1 refills | Status: DC | PRN

## 2020-03-06 MED ORDER — AMOXICILLIN-POT CLAVULANATE 875-125 MG PO TABS: 1.0000 | ORAL_TABLET | Freq: Two times a day (BID) | ORAL | 0 refills | Status: AC

## 2020-03-06 NOTE — Discharge Instructions (Addendum)
Recommend start Augmentin 875mg  twice a day as directed. May continue Albuterol nebulizer treatments every 6 hours as needed for wheezing and cough. Continue Ibuprofen as directed for pain or fever. Rest. Continue to push fluids to help loosen up mucus in chest. Follow-up if not improving within 5 days or go to the ER ASAP if symptoms worsen.

## 2020-03-06 NOTE — ED Provider Notes (Signed)
[facility]    CSN: [id number] Arrival date & time: 03/06/20  1245      History   Chief Complaint Chief Complaint  Patient presents with  . Cough  . Appointment    HPI Angela Middleton is a 20 y.o. female.   20 year old female presents with cough and wheezing that has gotten worse over the past 2 weeks. Started with an irritated throat and some nasal congestion and drainage about 3 weeks ago. Came here to Urgent Care on 02/12/2020 and was tested for COVID 19 and was negative. Thought to have a viral illness. Symptoms persisted and she went to a [facility] testing site about 1 week later and was positive for COVID 19 on 02/20/2020. Boyfriend also tested positive. Sinus congestion and pressure have improved some but still has some postnasal drainage and now coughing up thick, green and discolored mucus. Also having chills, sweats and more cough and noticeable wheezes. Denies any nausea, vomiting or diarrhea. Has been having persistent vaginal bleeding since starting her new OCP's- [facility]- switched from [place] about 3 weeks ago. Some cramping but no other unusual vaginal discharge or urinary symptoms. Has not contacted her GYN about this issue. Also has a history of asthma and has been using Albuterol nebulizer machine twice a day with good relief. Just used her last nebule today and indicates the Albuterol inhaler has not been helpful in the past. Former smoker. Has been vaccinated against COVID 19. Other chronic health issues include Hidradenitis suppurativa. Was switched to Yaz to try to help control condition and was prescribed an antibiotic gel with benzoyl peroxide but Medicaid won't cover gel so using OTC Benzoyl peroxide.    The history is provided by the patient.    Past Medical History:  Diagnosis Date  . Asthma     There are no problems to display for this patient.   History reviewed. No pertinent surgical history.  OB History    Gravida  2   Para      Term       Preterm      AB  1   Living        SAB  1   IAB      Ectopic      Multiple      Live Births               Home Medications    Prior to Admission medications   Medication Sig Start Date End Date Taking? Authorizing Provider  albuterol (PROVENTIL) (2.5 MG/3ML) 0.083% nebulizer solution Take 3 mLs (2.5 mg total) by nebulization every 6 (six) hours as needed for wheezing or shortness of breath. 03/06/20  Yes Devery Murgia, Nicholes Stairs, NP  albuterol (VENTOLIN HFA) 108 (90 Base) MCG/ACT inhaler Inhale 2 puffs into the lungs every 6 (six) hours as needed for wheezing or shortness of breath. 06/22/18  Yes Cook, Jayce G, DO  amoxicillin-clavulanate (AUGMENTIN) 875-125 MG tablet Take 1 tablet by mouth every 12 (twelve) hours for 7 days. 03/06/20 03/13/20 Yes Kiaya Haliburton, Nicholes Stairs, NP  drospirenone-ethinyl estradiol (YAZ) 3-0.02 MG tablet Take 1 tablet by mouth daily. 01/23/20  Yes [provider]  ibuprofen (ADVIL) 600 MG tablet Take 1 tablet (600 mg total) by mouth every 6 (six) hours as needed. 01/08/20   Melynda Ripple, MD  cetirizine (ZYRTEC) 10 MG tablet Take 10 mg by mouth daily.  12/14/19  [provider]  fluticasone (FLONASE) 50 MCG/ACT nasal spray Place 2 sprays  into both nostrils daily. 02/10/18 12/14/19  Lorin Picket, PA-C  SPRINTEC 28 0.25-35 MG-MCG tablet Take 1 tablet by mouth daily. 10/05/19 03/06/20  [provider]    Family History Family History  Problem Relation Age of Onset  . Asthma Mother   . Healthy Mother   . Diabetes Father   . Healthy Father   . Hypertension Father   . High Cholesterol Father   . Diabetes Paternal Grandfather   . Hypertension Paternal Grandfather   . Diabetes Paternal Grandmother   . Hypertension Paternal Grandmother   . Diabetes Maternal Uncle   . Hypertension Maternal Uncle     Social History Social History   Tobacco Use  . Smoking status: Former Smoker    Types: Cigarettes  . Smokeless tobacco: Former Engineer, maintenance (IT)  . Vaping Use: Every day  Substance Use Topics  . Alcohol use: Not Currently  . Drug use: Never     Allergies   Shrimp [shellfish allergy]   Review of Systems Review of Systems  Constitutional: Positive for appetite change, chills, diaphoresis and fatigue. Fever: possible.  HENT: Positive for congestion, postnasal drip and sore throat (irritated). Negative for ear discharge, facial swelling, mouth sores, nosebleeds, sinus pressure, sinus pain and trouble swallowing.   Eyes: Negative for pain, discharge, redness and itching.  Respiratory: Positive for cough, chest tightness and wheezing. Negative for shortness of breath and stridor.   Cardiovascular: Negative for chest pain.  Gastrointestinal: Positive for abdominal pain (lower abdominal cramping). Negative for diarrhea, nausea and vomiting.  Genitourinary: Positive for menstrual problem and vaginal bleeding. Negative for difficulty urinating, dysuria, flank pain and vaginal pain.  Musculoskeletal: Positive for arthralgias and myalgias. Negative for neck pain and neck stiffness.  Skin: Negative for color change and rash.  Allergic/Immunologic: Positive for food allergies. Negative for environmental allergies and immunocompromised state.  Neurological: Positive for headaches. Negative for dizziness, tremors, seizures, syncope, speech difficulty, weakness, light-headedness and numbness.  Hematological: Negative for adenopathy. Does not bruise/bleed easily.     Physical Exam Triage Vital Signs ED Triage Vitals  Enc Vitals Group     BP 03/06/20 1312 113/77     Pulse Rate 03/06/20 1312 100     Resp 03/06/20 1312 18     Temp 03/06/20 1312 98.3 F (36.8 C)     Temp Source 03/06/20 1312 Oral     SpO2 03/06/20 1312 100 %     Weight 03/06/20 1308 190 lb 0.6 oz (86.2 kg)     Height 03/06/20 1308 5\' 1"  (1.549 m)     Head Circumference --      Peak Flow --      Pain Score 03/06/20 1308 0     Pain Loc --      Pain Edu? --       Excl. in [place]? --    No data found.  Updated Vital Signs BP 113/77 (BP Location: Right Arm)   Pulse 100   Temp 98.3 F (36.8 C) (Oral)   Resp 18   Ht 5\' 1"  (1.549 m)   Wt 190 lb 0.6 oz (86.2 kg)   LMP 02/19/2020 (Approximate)   SpO2 100%   Breastfeeding No   BMI 35.91 kg/m   Visual Acuity Right Eye Distance:   Left Eye Distance:   Bilateral Distance:    Right Eye Near:   Left Eye Near:    Bilateral Near:     Physical Exam Vitals and nursing note reviewed.  Constitutional:      General: She is awake. She is not in acute distress.    Appearance: She is well-developed and overweight. She is ill-appearing.     Comments: She is sitting on the exam table in no acute distress but appears tired and ill.   HENT:     Head: Normocephalic and atraumatic.     Right Ear: Hearing, ear canal and external ear normal. Tympanic membrane is bulging.     Left Ear: Hearing, ear canal and external ear normal. Tympanic membrane is bulging.     Nose: Mucosal edema and congestion present.     Right Sinus: No frontal sinus tenderness.     Left Sinus: No frontal sinus tenderness.     Mouth/Throat:     Lips: Pink.     Mouth: Mucous membranes are moist.     Pharynx: Uvula midline. Oropharyngeal exudate and posterior oropharyngeal erythema present. No uvula swelling.     Comments: Slight thicker yellow post nasal drainage present Eyes:     Extraocular Movements: Extraocular movements intact.     Conjunctiva/sclera: Conjunctivae normal.  Cardiovascular:     Rate and Rhythm: Regular rhythm. Tachycardia present.     Heart sounds: Normal heart sounds. No murmur heard.   Pulmonary:     Effort: Pulmonary effort is normal. No respiratory distress.     Breath sounds: Examination of the right-upper field reveals decreased breath sounds and wheezing. Examination of the left-upper field reveals decreased breath sounds and wheezing. Decreased breath sounds and wheezing present. No rhonchi or rales.   Musculoskeletal:     Cervical back: Normal range of motion and neck supple. No rigidity.  Lymphadenopathy:     Cervical: No cervical adenopathy.  Skin:    General: Skin is warm and dry.     Capillary Refill: Capillary refill takes less than 2 seconds.     Findings: No rash.  Neurological:     General: No focal deficit present.     Mental Status: She is alert and oriented to person, place, and time.  Psychiatric:        Mood and Affect: Mood normal.        Behavior: Behavior normal. Behavior is cooperative.        Thought Content: Thought content normal.        Judgment: Judgment normal.      UC Treatments / Results  Labs (all labs ordered are listed, but only abnormal results are displayed) Labs Reviewed - No data to display  EKG   Radiology No results found.  Procedures Procedures (including critical care time)  Medications Ordered in UC Medications - No data to display  Initial Impression / Assessment and Plan / UC Course  I have reviewed the triage vital signs and the nursing notes.  Pertinent labs & imaging results that were available during my care of the patient were reviewed by me and considered in my medical decision making (see chart for details).    Discussed with patient that she appears to have a mild sinus infection and possible bronchitis- possible bacterial infection after viral infection (COVID 19). Will start her on Augmentin 875mg  twice a day as directed. Continue Albuterol nebulizer treatments every 6 hours as needed for wheezing and cough. Continue Ibuprofen 600mg  every 8 hours as needed for pain or fever. Rest. Continue to push fluids to help loosen up mucus in chest. May take OTC cough medication as directed if needed.  Reviewed with patient that irregular bleeding  common when changing oral contraceptives- but would contact her GYN to discuss for further evaluation, especially if it persists into her 2nd pack of pills.  Note written for work.   Recommend follow-up in 5 days if not improving or go to the ER ASAP if symptoms worsen.   Final Clinical Impressions(s) / UC Diagnoses   Final diagnoses:  Acute non-recurrent maxillary sinusitis  Bronchitis  Moderate persistent asthma with exacerbation  Abnormal vaginal bleeding     Discharge Instructions     Recommend start Augmentin 875mg  twice a day as directed. May continue Albuterol nebulizer treatments every 6 hours as needed for wheezing and cough. Continue Ibuprofen as directed for pain or fever. Rest. Continue to push fluids to help loosen up mucus in chest. Follow-up if not improving within 5 days or go to the ER ASAP if symptoms worsen.     ED Prescriptions    Medication Sig Dispense Auth. Provider   amoxicillin-clavulanate (AUGMENTIN) 875-125 MG tablet Take 1 tablet by mouth every 12 (twelve) hours for 7 days. 14 tablet Katy Apo, NP   albuterol (PROVENTIL) (2.5 MG/3ML) 0.083% nebulizer solution Take 3 mLs (2.5 mg total) by nebulization every 6 (six) hours as needed for wheezing or shortness of breath. 75 mL Katy Apo, NP     PDMP not reviewed this encounter.   Katy Apo, NP 03/07/20 1212

## 2020-03-06 NOTE — ED Triage Notes (Signed)
Pt c/o cough, wheezing. Started about 2 weeks ago after testing positive for covid. She has also had chills, sweats.

## 2020-03-26 ENCOUNTER — Ambulatory Visit: Payer: Self-pay

## 2020-03-26 ENCOUNTER — Other Ambulatory Visit: Payer: Self-pay

## 2020-03-26 ENCOUNTER — Ambulatory Visit
Admission: RE | Admit: 2020-03-26 | Discharge: 2020-03-26 | Disposition: A | Discharge status: Home / Self Care | Payer: Medicaid Other | Source: Ambulatory Visit | Attending: Family Medicine | Admitting: Family Medicine

## 2020-03-26 ENCOUNTER — Ambulatory Visit (INDEPENDENT_AMBULATORY_CARE_PROVIDER_SITE_OTHER): Payer: Medicaid Other

## 2020-03-26 VITALS — BP 104/72 | HR 113 | Temp 98.5°F | Resp 18 | Ht 61.0 in | Wt 196.0 lb

## 2020-03-26 DIAGNOSIS — R059 Cough, unspecified: Secondary | ICD-10-CM

## 2020-03-26 DIAGNOSIS — J45901 Unspecified asthma with (acute) exacerbation: Secondary | ICD-10-CM | POA: Diagnosis not present

## 2020-03-26 DIAGNOSIS — R300 Dysuria: Secondary | ICD-10-CM

## 2020-03-26 DIAGNOSIS — N939 Abnormal uterine and vaginal bleeding, unspecified: Secondary | ICD-10-CM

## 2020-03-26 DIAGNOSIS — Z113 Encounter for screening for infections with a predominantly sexual mode of transmission: Secondary | ICD-10-CM | POA: Diagnosis not present

## 2020-03-26 LAB — CHLAMYDIA/NGC RT PCR (ARMC ONLY)
Chlamydia Tr: NOT DETECTED
N gonorrhoeae: NOT DETECTED

## 2020-03-26 LAB — URINALYSIS, COMPLETE (UACMP) WITH MICROSCOPIC
Bilirubin Urine: NEGATIVE
Glucose, UA: NEGATIVE mg/dL
Ketones, ur: 15 mg/dL — AB
Leukocytes,Ua: NEGATIVE
Nitrite: NEGATIVE
Protein, ur: NEGATIVE mg/dL
Specific Gravity, Urine: >1.03 — ABNORMAL HIGH (ref 1.005–1.030)
pH: 6 (ref 5.0–8.0)

## 2020-03-26 LAB — WET PREP, GENITAL
Clue Cells Wet Prep HPF POC: NONE SEEN
Sperm: NONE SEEN
Trich, Wet Prep: NONE SEEN
Yeast Wet Prep HPF POC: NONE SEEN

## 2020-03-26 LAB — PREGNANCY, URINE: Preg Test, Ur: NEGATIVE

## 2020-03-26 MED ORDER — PREDNISONE 20 MG PO TABS: 40.0000 mg | ORAL_TABLET | Freq: Every day | ORAL | 0 refills | Status: AC

## 2020-03-26 MED ORDER — PHENAZOPYRIDINE HCL 200 MG PO TABS: 200.0000 mg | ORAL_TABLET | Freq: Three times a day (TID) | ORAL | 0 refills | Status: DC

## 2020-03-26 MED ORDER — BENZONATATE 100 MG PO CAPS: 200.0000 mg | ORAL_CAPSULE | Freq: Three times a day (TID) | ORAL | 0 refills | Status: AC | PRN

## 2020-03-26 NOTE — ED Triage Notes (Addendum)
Pt c/o burning and pain with urination since last night. Pt denies f/n/v/d or abdominal pain. Pt would like to be tested for STIs by swab. Pt also c/o continued cough that requires constant albuterol use. She states it has not improved since she was seen here last. Pt also reports constant menstrual bleeding for the past 2 months since starting a new oral birth control, she does have appt scheduled with gyn this Thursday.

## 2020-03-26 NOTE — ED Provider Notes (Signed)
[facility]    CSN: [id number] Arrival date & time: 03/26/20  1355      History   Chief Complaint Chief Complaint  Patient presents with  . Dysuria  . Cough    HPI Angela Middleton is a 20 y.o. female presenting with multiple complaints.  First, patient states that she has had an abnormal feeling when she urinates since last night.  She says it does not really burn, but feels abnormal. Admits to frequency. She denies any associated nausea, vomiting or diarrhea.  Denies any abdominal or pelvic pain.  Patient denies any vaginal discharge, itching or odor.  She denies any concern for STIs, but does request STI testing.  Additionally, patient states that she has had a continued cough for the past month.  Patient seen at [facility] urgent care over 2 weeks ago and treated with Augmentin for sinus infection and bronchitis. She says that did not seem to help her cough. She says that the cough is dry.  She reports history of COVID-19 about a month ago.  Patient states that she has to frequently use albuterol for the cough and shortness of breath. She says that the shortness of breath is not significant and she does not feel short of breath right now. She does have history of asthma. Does not have a maintenance inhaler and only uses albuterol as needed. She says that she is using it every couple of days. Denies any chest pain or back pain. No fever, chills or sweats.  Finally, patient states that she has had abnormal vaginal bleeding for the past 2 months.  Patient started taking [facility] 2 months ago and has had irregular and abnormal vaginal bleeding since.  She does have an appointment with her gynecologist in 2 days for this complaint. She states she thought that she would just mention it since she is here. She has no other concerns today.  HPI  Past Medical History:  Diagnosis Date  . Asthma     There are no problems to display for this patient.   History reviewed. No pertinent  surgical history.  OB History    Gravida  2   Para      Term      Preterm      AB  1   Living        SAB  1   IAB      Ectopic      Multiple      Live Births               Home Medications    Prior to Admission medications   Medication Sig Start Date End Date Taking? Authorizing Provider  albuterol (VENTOLIN HFA) 108 (90 Base) MCG/ACT inhaler Inhale 2 puffs into the lungs every 6 (six) hours as needed for wheezing or shortness of breath. 06/22/18  Yes Cook, Jayce G, DO  benzonatate (TESSALON) 100 MG capsule Take 2 capsules (200 mg total) by mouth 3 (three) times daily as needed for up to 10 days for cough. 03/26/20 04/05/20 Yes Danton Clap, PA-C  drospirenone-ethinyl estradiol (YAZ) 3-0.02 MG tablet Take 1 tablet by mouth daily. 01/23/20  Yes [provider]  ibuprofen (ADVIL) 600 MG tablet Take 1 tablet (600 mg total) by mouth every 6 (six) hours as needed. 01/08/20  Yes Melynda Ripple, MD  phenazopyridine (PYRIDIUM) 200 MG tablet Take 1 tablet (200 mg total) by mouth 3 (three) times daily. 03/26/20  Yes Danton Clap, PA-C  predniSONE (DELTASONE) 20 MG tablet Take 2 tablets (40 mg total) by mouth daily for 5 days. 03/26/20 03/31/20 Yes Laurene Footman B, PA-C  albuterol (PROVENTIL) (2.5 MG/3ML) 0.083% nebulizer solution Take 3 mLs (2.5 mg total) by nebulization every 6 (six) hours as needed for wheezing or shortness of breath. 03/06/20   Amyot, Nicholes Stairs, NP  cetirizine (ZYRTEC) 10 MG tablet Take 10 mg by mouth daily.  12/14/19  [provider]  fluticasone (FLONASE) 50 MCG/ACT nasal spray Place 2 sprays into both nostrils daily. 02/10/18 12/14/19  Lorin Picket, PA-C  SPRINTEC 28 0.25-35 MG-MCG tablet Take 1 tablet by mouth daily. 10/05/19 03/06/20  [provider]    Family History Family History  Problem Relation Age of Onset  . Asthma Mother   . Healthy Mother   . Diabetes Father   . Healthy Father   . Hypertension Father   . High  Cholesterol Father   . Diabetes Paternal Grandfather   . Hypertension Paternal Grandfather   . Diabetes Paternal Grandmother   . Hypertension Paternal Grandmother   . Diabetes Maternal Uncle   . Hypertension Maternal Uncle     Social History Social History   Tobacco Use  . Smoking status: Former Smoker    Types: Cigarettes  . Smokeless tobacco: Former Network engineer  . Vaping Use: Every day  Substance Use Topics  . Alcohol use: Not Currently  . Drug use: Never     Allergies   Shrimp [shellfish allergy]   Review of Systems Review of Systems  Constitutional: Negative for chills, diaphoresis, fatigue and fever.  HENT: Negative for congestion, ear pain, rhinorrhea, sinus pressure, sinus pain and sore throat.   Respiratory: Positive for cough. Negative for shortness of breath.   Gastrointestinal: Negative for abdominal pain, diarrhea, nausea and vomiting.  Genitourinary: Positive for dysuria, frequency, menstrual problem, urgency and vaginal bleeding. Negative for decreased urine volume, flank pain, hematuria, pelvic pain, vaginal discharge and vaginal pain.  Musculoskeletal: Negative for arthralgias, back pain and myalgias.  Skin: Negative for rash.  Neurological: Negative for weakness and headaches.  Hematological: Negative for adenopathy.     Physical Exam Triage Vital Signs ED Triage Vitals  Enc Vitals Group     BP 03/26/20 1409 104/72     Pulse Rate 03/26/20 1409 (!) 113     Resp 03/26/20 1409 18     Temp 03/26/20 1409 98.5 F (36.9 C)     Temp Source 03/26/20 1409 Oral     SpO2 03/26/20 1409 99 %     Weight 03/26/20 1404 196 lb (88.9 kg)     Height 03/26/20 1404 5\' 1"  (1.549 m)     Head Circumference --      Peak Flow --      Pain Score 03/26/20 1404 9     Pain Loc --      Pain Edu? --      Excl. in [place]? --    No data found.  Updated Vital Signs BP 104/72 (BP Location: Left Arm)   Pulse (!) 113   Temp 98.5 F (36.9 C) (Oral)   Resp 18   Ht 5\' 1"   (1.549 m)   Wt 196 lb (88.9 kg)   SpO2 99%   BMI 37.03 kg/m       Physical Exam Vitals and nursing note reviewed.  Constitutional:      General: She is not in acute distress.    Appearance: Normal appearance. She is  not ill-appearing or toxic-appearing.     Comments: Fleeting eye contact  HENT:     Head: Normocephalic and atraumatic.     Nose: Nose normal.     Mouth/Throat:     Mouth: Mucous membranes are moist.     Pharynx: Oropharynx is clear.  Eyes:     General: No scleral icterus.       Right eye: No discharge.        Left eye: No discharge.     Conjunctiva/sclera: Conjunctivae normal.  Cardiovascular:     Rate and Rhythm: Regular rhythm. Tachycardia present.     Heart sounds: Normal heart sounds.  Pulmonary:     Effort: Pulmonary effort is normal. No respiratory distress.     Breath sounds: Normal breath sounds. No wheezing, rhonchi or rales.  Abdominal:     Palpations: Abdomen is soft.     Tenderness: There is no abdominal tenderness. There is no right CVA tenderness or left CVA tenderness.  Musculoskeletal:     Cervical back: Neck supple.  Skin:    General: Skin is dry.  Neurological:     General: No focal deficit present.     Mental Status: She is alert. Mental status is at baseline.     Motor: No weakness.     Gait: Gait normal.  Psychiatric:        Mood and Affect: Mood normal.        Behavior: Behavior normal.        Thought Content: Thought content normal.      UC Treatments / Results  Labs (all labs ordered are listed, but only abnormal results are displayed) Labs Reviewed  WET PREP, GENITAL - Abnormal; Notable for the following components:      Result Value   WBC, Wet Prep HPF POC FEW (*)    All other components within normal limits  URINALYSIS, COMPLETE (UACMP) WITH MICROSCOPIC - Abnormal; Notable for the following components:   APPearance HAZY (*)    Specific Gravity, Urine >1.030 (*)    Hgb urine dipstick MODERATE (*)    Ketones, ur 15  (*)    Non Squamous Epithelial PRESENT (*)    Bacteria, UA RARE (*)    All other components within normal limits  CHLAMYDIA/NGC RT PCR ([facility] ONLY)  URINE CULTURE  PREGNANCY, URINE    EKG   Radiology No results found.  Procedures Procedures (including critical care time)  Medications Ordered in UC Medications - No data to display  Initial Impression / Assessment and Plan / UC Course  I have reviewed the triage vital signs and the nursing notes.  Pertinent labs & imaging results that were available during my care of the patient were reviewed by me and considered in my medical decision making (see chart for details).   1. Dysuria: 20 year old female with complaint of dysuria x1 day.  Urinalysis obtained today reveals: He is urine, specific gravity greater than 1.030, moderate blood and ketones. We will send urine for culture and treat for UTI if urine culture grows bacteria. UA does not really suggest UTI. Encouraged her to increase fluids since she is mildly dehydrated. Sent Pyridium.  2. STI testing: Wet prep is normal. STI testing obtained today.  Patient aware how to access results.  Advised if positive will need treatment and to notify partner or partners.  3. Cough: Patient reports history of COVID-19 about a month ago.  She has had persistent cough since. Exam is within normal limits. Chest  is clear to auscultation. Chest x-ray obtained today.  Independently reviewed by me and normal.  Chest x-ray over read is within normal limits. Suspect asthma exacerbation. Treating with prednisone and encouraged her to continue use inhalers as needed. Advised her to follow-up with PCP as she may need alteration to her asthma medications. ED precautions discussed for worsening cough, fever, chest pain or breathing difficulty.  4. Abnormal vaginal bleeding: Urine pregnancy was obtained today and negative.  Heavy vaginal bleeding that is persisting to be related to the oral  contraceptives that she is taking.  Patient has appointment with her gynecologist in 2 days for evaluation of her abnormal vaginal bleeding   Final Clinical Impressions(s) / UC Diagnoses   Final diagnoses:  Dysuria  Routine screening for STI (sexually transmitted infection)  Cough  Asthma with acute exacerbation, unspecified asthma severity, unspecified whether persistent  Abnormal vaginal bleeding     Discharge Instructions     I will call with the results of the chest x-ray if it is abnormal. Seems to me like you are having an exacerbation of your asthma. I have sent prednisone to the pharmacy. Continue to use albuterol as needed. If you are not feeling better after the prednisone then he should follow-up with PCP about possible adjustments to your asthma regimen. Return or go to ED for any fever, worsening cough or breathing difficulty. If the chest x-ray shows you have pneumonia may send an antibiotic. Increase rest and fluids. We will send a cough medicine.  The urine test does not appear to be consistent with a UTI. I will send the urine for culture and you will be called to start antibiotic if there is bacterial growth in the urine culture. At this time I have sent a medication to help with the burning sensation. Increase fluids.  STI testing obtained. Results to be available on your MyChart. We will call if something is positive. Always use barrier protection.  Follow-up with your gynecologist about your irregular menstrual periods. Seems like it is related to your birth control method so it may be adjusted and where you might need further work-up. Go to ED for any severe bleeding or pelvic pain.    ED Prescriptions    Medication Sig Dispense Auth. Provider   phenazopyridine (PYRIDIUM) 200 MG tablet Take 1 tablet (200 mg total) by mouth 3 (three) times daily. 6 tablet Laurene Footman B, PA-C   benzonatate (TESSALON) 100 MG capsule Take 2 capsules (200 mg total) by mouth 3 (three)  times daily as needed for up to 10 days for cough. 30 capsule Laurene Footman B, PA-C   predniSONE (DELTASONE) 20 MG tablet Take 2 tablets (40 mg total) by mouth daily for 5 days. 10 tablet Gretta Cool     PDMP not reviewed this encounter.   Danton Clap, PA-C 03/26/20 1556

## 2020-03-26 NOTE — Discharge Instructions (Addendum)
I will call with the results of the chest x-ray if it is abnormal. Seems to me like you are having an exacerbation of your asthma. I have sent prednisone to the pharmacy. Continue to use albuterol as needed. If you are not feeling better after the prednisone then he should follow-up with PCP about possible adjustments to your asthma regimen. Return or go to ED for any fever, worsening cough or breathing difficulty. If the chest x-ray shows you have pneumonia may send an antibiotic. Increase rest and fluids. We will send a cough medicine.  The urine test does not appear to be consistent with a UTI. I will send the urine for culture and you will be called to start antibiotic if there is bacterial growth in the urine culture. At this time I have sent a medication to help with the burning sensation. Increase fluids.  STI testing obtained. Results to be available on your MyChart. We will call if something is positive. Always use barrier protection.  Follow-up with your gynecologist about your irregular menstrual periods. Seems like it is related to your birth control method so it may be adjusted and where you might need further work-up. Go to ED for any severe bleeding or pelvic pain.

## 2020-03-28 LAB — URINE CULTURE: Culture: 40000 — AB

## 2020-04-09 ENCOUNTER — Other Ambulatory Visit: Payer: Self-pay

## 2020-04-09 ENCOUNTER — Ambulatory Visit
Admission: RE | Admit: 2020-04-09 | Discharge: 2020-04-09 | Disposition: A | Discharge status: Home / Self Care | Payer: Medicaid Other | Source: Ambulatory Visit | Attending: Family Medicine | Admitting: Family Medicine

## 2020-04-09 VITALS — BP 113/76 | HR 94 | Temp 99.0°F | Resp 18 | Ht 62.0 in | Wt 199.8 lb

## 2020-04-09 DIAGNOSIS — J029 Acute pharyngitis, unspecified: Secondary | ICD-10-CM

## 2020-04-09 DIAGNOSIS — R509 Fever, unspecified: Secondary | ICD-10-CM

## 2020-04-09 DIAGNOSIS — R0981 Nasal congestion: Secondary | ICD-10-CM | POA: Diagnosis not present

## 2020-04-09 DIAGNOSIS — J020 Streptococcal pharyngitis: Secondary | ICD-10-CM | POA: Diagnosis not present

## 2020-04-09 DIAGNOSIS — Z3202 Encounter for pregnancy test, result negative: Secondary | ICD-10-CM

## 2020-04-09 DIAGNOSIS — R300 Dysuria: Secondary | ICD-10-CM

## 2020-04-09 DIAGNOSIS — H66001 Acute suppurative otitis media without spontaneous rupture of ear drum, right ear: Secondary | ICD-10-CM | POA: Diagnosis not present

## 2020-04-09 DIAGNOSIS — R059 Cough, unspecified: Secondary | ICD-10-CM | POA: Diagnosis not present

## 2020-04-09 DIAGNOSIS — H9201 Otalgia, right ear: Secondary | ICD-10-CM

## 2020-04-09 LAB — WET PREP, GENITAL
Clue Cells Wet Prep HPF POC: NONE SEEN
Sperm: NONE SEEN
Trich, Wet Prep: NONE SEEN
WBC, Wet Prep HPF POC: NONE SEEN
Yeast Wet Prep HPF POC: NONE SEEN

## 2020-04-09 LAB — GROUP A STREP BY PCR: Group A Strep by PCR: DETECTED — AB

## 2020-04-09 LAB — URINALYSIS, COMPLETE (UACMP) WITH MICROSCOPIC
Bilirubin Urine: NEGATIVE
Glucose, UA: NEGATIVE mg/dL
Hgb urine dipstick: NEGATIVE
Ketones, ur: NEGATIVE mg/dL
Leukocytes,Ua: NEGATIVE
Nitrite: NEGATIVE
Protein, ur: NEGATIVE mg/dL
RBC / HPF: NONE SEEN RBC/hpf (ref 0–5)
Specific Gravity, Urine: 1.025 (ref 1.005–1.030)
WBC, UA: NONE SEEN WBC/hpf (ref 0–5)
pH: 6 (ref 5.0–8.0)

## 2020-04-09 LAB — PREGNANCY, URINE: Preg Test, Ur: NEGATIVE

## 2020-04-09 MED ORDER — ALBUTEROL SULFATE (2.5 MG/3ML) 0.083% IN NEBU
2.5000 mg | INHALATION_SOLUTION | Freq: Four times a day (QID) | RESPIRATORY_TRACT | 1 refills | Status: DC | PRN
Start: 2020-04-09 — End: 2020-08-05

## 2020-04-09 MED ORDER — AMOXICILLIN-POT CLAVULANATE 875-125 MG PO TABS: 1.0000 | ORAL_TABLET | Freq: Two times a day (BID) | ORAL | 0 refills | Status: AC

## 2020-04-09 NOTE — ED Triage Notes (Signed)
Patient also c/o continued discomfort with urination. Patient seen at [facility] for same on 03/26/20. Patient was prescribed Pyridium, but states it didn't help.

## 2020-04-09 NOTE — Discharge Instructions (Addendum)
Strep is positive. Take all of the augmentin. This will also cover the ear infection.  Cough likely related to asthma and post COVID continued symptoms. I refilled the albuterol. Continue and f/u with PCP or pulmonologist if not getting better.  Increase rest and fluids.  Urinalysis was normal but I am going to send it for culture.  Does not appear like you have a UTI, but the Augmentin I sent will cover UTI.  Keep follow-up with your PCP at the end of this month for continued dysuria symptoms and irregular menses.

## 2020-04-09 NOTE — ED Provider Notes (Signed)
[facility]    CSN: [id number] Arrival date & time: 04/09/20  1150      History   Chief Complaint Chief Complaint  Patient presents with  . Appointment  . Nasal Congestion  . Fever  . Otalgia    right  . Cough  . Sore Throat  . Dysuria    HPI Angela Middleton is a 20 y.o. female presenting for 3-day history of fever up to 103 degrees, sore throat and painful swallowing, right-sided ear pain and nasal congestion. Patient also reports a cough since January 2021. Patient had COVID-19 in January 2021 and states she has had continued dry cough since. She does have asthma and states that when she uses her asthma inhalers that the cough gets better. She denies any chest tightness or breathing difficulty. No worsening cough recently. Patient did take a rapid Covid test through [facility] today and it was negative. No recent COVID-19 exposure. Fully vaccinated COVID-19.  Additionally, patient mentions that she has had continued dysuria for the past couple of weeks. Patient seen 2 weeks ago for this complaint and urinalysis was normal but a urine culture grew multiple mixed colonies. Patient not treated for UTI with any antibiotics at that time. She admits to feeling irritated in her vaginal region and having the occasional discomfort when she urinates. No frequency urgency. No vaginal discharge. Patient was seen by gynecology on 03/28/2020 for her heavy and irregular menstrual cycles and advised to increase her OCPs. Patient states that she did not like the gynecology office she went to and decided to stop her OCPs. She states that she has not had a menstrual period since. She is sexually active. No concern for STIs. No other complaints or concerns today.  HPI  Past Medical History:  Diagnosis Date  . Asthma     There are no problems to display for this patient.   Past Surgical History:  Procedure Laterality Date  . NO PAST SURGERIES      OB History    Gravida  2   Para       Term      Preterm      AB  1   Living        SAB  1   IAB      Ectopic      Multiple      Live Births               Home Medications    Prior to Admission medications   Medication Sig Start Date End Date Taking? Authorizing Provider  albuterol (VENTOLIN HFA) 108 (90 Base) MCG/ACT inhaler Inhale 2 puffs into the lungs every 6 (six) hours as needed for wheezing or shortness of breath. 06/22/18  Yes Cook, Jayce G, DO  amoxicillin-clavulanate (AUGMENTIN) 875-125 MG tablet Take 1 tablet by mouth every 12 (twelve) hours for 10 days. 04/09/20 04/19/20 Yes Laurene Footman B, PA-C  ibuprofen (ADVIL) 600 MG tablet Take 1 tablet (600 mg total) by mouth every 6 (six) hours as needed. 01/08/20  Yes Melynda Ripple, MD  albuterol (PROVENTIL) (2.5 MG/3ML) 0.083% nebulizer solution Take 3 mLs (2.5 mg total) by nebulization every 6 (six) hours as needed for wheezing or shortness of breath. 04/09/20   Danton Clap, PA-C  drospirenone-ethinyl estradiol (YAZ) 3-0.02 MG tablet Take 1 tablet by mouth daily. 01/23/20   [provider]  phenazopyridine (PYRIDIUM) 200 MG tablet Take 1 tablet (200 mg total) by mouth 3 (three) times daily.  03/26/20   Danton Clap, PA-C  cetirizine (ZYRTEC) 10 MG tablet Take 10 mg by mouth daily.  12/14/19  [provider]  fluticasone (FLONASE) 50 MCG/ACT nasal spray Place 2 sprays into both nostrils daily. 02/10/18 12/14/19  Lorin Picket, PA-C  SPRINTEC 28 0.25-35 MG-MCG tablet Take 1 tablet by mouth daily. 10/05/19 03/06/20  [provider]    Family History Family History  Problem Relation Age of Onset  . Asthma Mother   . Healthy Mother   . Diabetes Father   . Healthy Father   . Hypertension Father   . High Cholesterol Father   . Diabetes Paternal Grandfather   . Hypertension Paternal Grandfather   . Diabetes Paternal Grandmother   . Hypertension Paternal Grandmother   . Diabetes Maternal Uncle   . Hypertension Maternal Uncle      Social History Social History   Tobacco Use  . Smoking status: Never Smoker  . Smokeless tobacco: Never Used  Vaping Use  . Vaping Use: Every day  . Substances: Nicotine, Flavoring  Substance Use Topics  . Alcohol use: Never  . Drug use: Yes    Types: Marijuana    Comment: 4-5 times weekly     Allergies   Shrimp [shellfish allergy]   Review of Systems Review of Systems  Constitutional: Positive for fatigue. Negative for chills, diaphoresis and fever.  HENT: Positive for congestion, ear pain, rhinorrhea and sore throat. Negative for sinus pressure and sinus pain.   Respiratory: Positive for cough. Negative for shortness of breath and wheezing.   Cardiovascular: Negative for chest pain.  Gastrointestinal: Negative for abdominal pain, nausea and vomiting.  Genitourinary: Positive for dysuria. Negative for frequency, vaginal discharge and vaginal pain.  Musculoskeletal: Negative for arthralgias and myalgias.  Skin: Negative for rash.  Neurological: Negative for weakness and headaches.  Hematological: Negative for adenopathy.     Physical Exam Triage Vital Signs ED Triage Vitals  Enc Vitals Group     BP 04/09/20 1222 113/76     Pulse Rate 04/09/20 1222 94     Resp 04/09/20 1222 18     Temp 04/09/20 1222 99 F (37.2 C)     Temp Source 04/09/20 1222 Oral     SpO2 04/09/20 1222 100 %     Weight 04/09/20 1223 199 lb 12.8 oz (90.6 kg)     Height 04/09/20 1223 5\' 2"  (1.575 m)     Head Circumference --      Peak Flow --      Pain Score 04/09/20 1221 7     Pain Loc --      Pain Edu? --      Excl. in [place]? --    No data found.  Updated Vital Signs BP 113/76 (BP Location: Left Arm)   Pulse 94   Temp 99 F (37.2 C) (Oral)   Resp 18   Ht 5\' 2"  (1.575 m)   Wt 199 lb 12.8 oz (90.6 kg)   LMP 02/10/2020 (Approximate)   SpO2 100%   BMI 36.54 kg/m       Physical Exam Vitals and nursing note reviewed.  Constitutional:      General: She is not in acute  distress.    Appearance: Normal appearance. She is well-developed. She is not ill-appearing or toxic-appearing.  HENT:     Head: Normocephalic and atraumatic.     Right Ear: Ear canal normal. Tympanic membrane is erythematous.     Left Ear: Tympanic membrane  and ear canal normal.     Nose: Rhinorrhea (mild) present.     Mouth/Throat:     Mouth: Mucous membranes are moist.     Pharynx: Oropharynx is clear.     Tonsils: 2+ on the right. 2+ on the left.  Eyes:     General: No scleral icterus.       Right eye: No discharge.        Left eye: No discharge.     Conjunctiva/sclera: Conjunctivae normal.  Cardiovascular:     Rate and Rhythm: Normal rate and regular rhythm.     Heart sounds: Normal heart sounds.  Pulmonary:     Effort: Pulmonary effort is normal. No respiratory distress.     Breath sounds: Normal breath sounds. No wheezing, rhonchi or rales.  Musculoskeletal:     Cervical back: Neck supple.  Skin:    General: Skin is dry.  Neurological:     General: No focal deficit present.     Mental Status: She is alert. Mental status is at baseline.     Motor: No weakness.     Gait: Gait normal.  Psychiatric:        Mood and Affect: Mood normal.        Behavior: Behavior normal.        Thought Content: Thought content normal.      UC Treatments / Results  Labs (all labs ordered are listed, but only abnormal results are displayed) Labs Reviewed  GROUP A STREP BY PCR - Abnormal; Notable for the following components:      Result Value   Group A Strep by PCR DETECTED (*)    All other components within normal limits  URINALYSIS, COMPLETE (UACMP) WITH MICROSCOPIC - Abnormal; Notable for the following components:   Bacteria, UA FEW (*)    All other components within normal limits  WET PREP, GENITAL  URINE CULTURE  PREGNANCY, URINE    EKG   Radiology No results found.  Procedures Procedures (including critical care time)  Medications Ordered in UC Medications - No  data to display  Initial Impression / Assessment and Plan / UC Course  I have reviewed the triage vital signs and the nursing notes.  Pertinent labs & imaging results that were available during my care of the patient were reviewed by me and considered in my medical decision making (see chart for details).    1. Sore throat, nasal congestion, fever: Admits to the symptoms x3 days. Vital signs are normal and stable in clinic. Exam significant for moderate posterior pharyngeal erythema and enlarged tonsils as well as minor nasal congestion. Rapid strep test is positive.  2. Right-sided ear pain: Exam consistent with acute otitis media of the right ear. Treating both strep and the ear infection with Augmentin x10 days.  3. Cough and asthma: Chest x-ray at last office visit 2 weeks ago was normal. No worsening symptoms since. Vital signs reassuring. Patient requesting refill of her albuterol nebulizer solution so I refilled that. Advised her to speak with her PCP about ongoing cough but to increase her rest and fluids. Explained that sometimes people have continued symptoms of post COVID and cough as well as the symptoms. Advised her to follow-up with [place] if she has worsening cough, any fever or increased breathing difficulty.  4. Dysuria: Urinalysis today is within normal limits and wet prep is normal also. We will culture urine and treat for UTI if positive. She is already to be on Augmentin for  her strep throat and ear infection. Advised her that this covers most UTIs but if we need to change something we can. If urine culture is negative she is still having symptoms she is to follow-up with her PCP. She does have an appoint with her PCP in the next couple weeks. Follow-up with her primary as needed.  Final Clinical Impressions(s) / UC Diagnoses   Final diagnoses:  Strep pharyngitis  Acute suppurative otitis media of right ear without spontaneous rupture of tympanic membrane, recurrence not specified   Cough  Dysuria     Discharge Instructions     Strep is positive. Take all of the augmentin. This will also cover the ear infection.  Cough likely related to asthma and post COVID continued symptoms. I refilled the albuterol. Continue and f/u with PCP or pulmonologist if not getting better.  Increase rest and fluids.  Urinalysis was normal but I am going to send it for culture.  Does not appear like you have a UTI, but the Augmentin I sent will cover UTI.  Keep follow-up with your PCP at the end of this month for continued dysuria symptoms and irregular menses.    ED Prescriptions    Medication Sig Dispense Auth. Provider   amoxicillin-clavulanate (AUGMENTIN) 875-125 MG tablet Take 1 tablet by mouth every 12 (twelve) hours for 10 days. 20 tablet Laurene Footman B, PA-C   albuterol (PROVENTIL) (2.5 MG/3ML) 0.083% nebulizer solution Take 3 mLs (2.5 mg total) by nebulization every 6 (six) hours as needed for wheezing or shortness of breath. 75 mL Danton Clap, PA-C     PDMP not reviewed this encounter.   Danton Clap, PA-C 04/09/20 1425

## 2020-04-09 NOTE — ED Triage Notes (Signed)
Patient in today c/o cough since Jan 2022, when she had covid. Patient [facility] and [facility] was negative. Patient also c/o fever (100.3), sore throat, right ear pain and nasal congestion x 3 days. Patient has had the covid vaccines and the booster. Patient has taken OTC cold/flu night/day medicine and Advil.

## 2020-04-11 LAB — URINE CULTURE

## 2020-08-05 ENCOUNTER — Ambulatory Visit
Admission: RE | Admit: 2020-08-05 | Discharge: 2020-08-05 | Disposition: A | Discharge status: Home / Self Care | Payer: Medicaid Other | Source: Ambulatory Visit | Attending: Emergency Medicine | Admitting: Emergency Medicine

## 2020-08-05 ENCOUNTER — Other Ambulatory Visit: Payer: Self-pay

## 2020-08-05 VITALS — BP 106/65 | HR 98 | Temp 98.7°F | Resp 18 | Wt 209.0 lb

## 2020-08-05 DIAGNOSIS — L03011 Cellulitis of right finger: Secondary | ICD-10-CM

## 2020-08-05 MED ORDER — SULFAMETHOXAZOLE-TRIMETHOPRIM 800-160 MG PO TABS: 1.0000 | ORAL_TABLET | Freq: Two times a day (BID) | ORAL | 0 refills | Status: AC

## 2020-08-05 NOTE — ED Provider Notes (Signed)
[facility]    CSN: [id number] Arrival date & time: 08/05/20  1639      History   Chief Complaint Chief Complaint  Patient presents with   Nail Problem    Right pinky     Appointment    HPI Angela Middleton is a 20 y.o. female.   HPI  20 year old female here for evaluation of right fifth finger redness.  Patient reports that she pulled a hangnail ago and she has noticed some swelling and throbbing around her fingernail 5 days ago.  She denies numbness or tingling or drainage from the area.  Past Medical History:  Diagnosis Date   Asthma     There are no problems to display for this patient.   Past Surgical History:  Procedure Laterality Date   NO PAST SURGERIES      OB History     Gravida  2   Para      Term      Preterm      AB  1   Living         SAB  1   IAB      Ectopic      Multiple      Live Births               Home Medications    Prior to Admission medications   Medication Sig Start Date End Date Taking? Authorizing Provider  sulfamethoxazole-trimethoprim (BACTRIM DS) 800-160 MG tablet Take 1 tablet by mouth 2 (two) times daily for 7 days. 08/05/20 08/12/20 Yes Margarette Canada, NP  cetirizine (ZYRTEC) 10 MG tablet Take 10 mg by mouth daily.  12/14/19  [provider]  fluticasone (FLONASE) 50 MCG/ACT nasal spray Place 2 sprays into both nostrils daily. 02/10/18 12/14/19  Lorin Picket, PA-C  SPRINTEC 28 0.25-35 MG-MCG tablet Take 1 tablet by mouth daily. 10/05/19 03/06/20  [provider]    Family History Family History  Problem Relation Age of Onset   Asthma Mother    Healthy Mother    Diabetes Father    Healthy Father    Hypertension Father    High Cholesterol Father    Diabetes Paternal Grandfather    Hypertension Paternal Grandfather    Diabetes Paternal Grandmother    Hypertension Paternal Grandmother    Diabetes Maternal Uncle    Hypertension Maternal Uncle     Social  History Social History   Tobacco Use   Smoking status: Never   Smokeless tobacco: Never  Vaping Use   Vaping Use: Every day   Substances: Nicotine, Flavoring  Substance Use Topics   Alcohol use: Never   Drug use: Yes    Types: Marijuana    Comment: 4-5 times weekly     Allergies   Shrimp [shellfish allergy]   Review of Systems Review of Systems  Constitutional:  Negative for fever.  Musculoskeletal:  Positive for joint swelling.  Skin:  Positive for color change.    Physical Exam Triage Vital Signs ED Triage Vitals  Enc Vitals Group     BP 08/05/20 1719 106/65     Pulse Rate 08/05/20 1719 98     Resp 08/05/20 1719 18     Temp 08/05/20 1719 98.7 F (37.1 C)     Temp Source 08/05/20 1719 Oral     SpO2 08/05/20 1719 100 %     Weight 08/05/20 1718 209 lb (94.8 kg)     Height --  Head Circumference --      Peak Flow --      Pain Score 08/05/20 1717 9     Pain Loc --      Pain Edu? --      Excl. in [place]? --    No data found.  Updated Vital Signs BP 106/65 (BP Location: Right Arm)   Pulse 98   Temp 98.7 F (37.1 C) (Oral)   Resp 18   Wt 209 lb (94.8 kg)   LMP 07/19/2020   SpO2 100%   BMI 38.23 kg/m   Visual Acuity Right Eye Distance:   Left Eye Distance:   Bilateral Distance:    Right Eye Near:   Left Eye Near:    Bilateral Near:     Physical Exam Vitals and nursing note reviewed.  Constitutional:      General: She is not in acute distress.    Appearance: She is normal weight. She is not ill-appearing.  HENT:     Head: Normocephalic and atraumatic.  Musculoskeletal:        General: Tenderness present. Normal range of motion.  Skin:    General: Skin is warm and dry.     Findings: Erythema present.  Neurological:     General: No focal deficit present.     Mental Status: She is alert and oriented to person, place, and time.  Psychiatric:        Mood and Affect: Mood normal.        Behavior: Behavior normal.        Thought Content:  Thought content normal.        Judgment: Judgment normal.     UC Treatments / Results  Labs (all labs ordered are listed, but only abnormal results are displayed) Labs Reviewed - No data to display  EKG   Radiology No results found.  Procedures Procedures (including critical care time)  Medications Ordered in UC Medications - No data to display  Initial Impression / Assessment and Plan / UC Course  I have reviewed the triage vital signs and the nursing notes.  Pertinent labs & imaging results that were available during my care of the patient were reviewed by me and considered in my medical decision making (see chart for details).  Patient is a pleasant, nontoxic-appearing 20 year old female here for evaluation of redness, swelling, and throbbing to the tip of her right fifth finger as outlined HPI above.  Patient's physical exam reveals mild erythema to the medial and proximal edge of the fingernail without fluctuance or induration.  There is some dark drainage between the cuticle and the nail.  Patient exam is consistent with a developing paronychia.  We will treat patient with Bactrim twice daily for 7 days and have patient use Epsom salt soaks to help dry out any residual infection.  Patient vies to return for any increased redness, swelling, or pain.   Final Clinical Impressions(s) / UC Diagnoses   Final diagnoses:  Cellulitis of finger of right hand     Discharge Instructions      Take the Bactrim twice daily with a full glass of water for 7 days.  Soak your finger twice daily in warm water and Epsom salts.  Return for increased redness, swelling, or pain.     ED Prescriptions     Medication Sig Dispense Auth. Provider   sulfamethoxazole-trimethoprim (BACTRIM DS) 800-160 MG tablet Take 1 tablet by mouth 2 (two) times daily for 7 days. 14 tablet Margarette Canada,  NP      PDMP not reviewed this encounter.   Margarette Canada, NP 08/05/20 1801

## 2020-08-05 NOTE — ED Triage Notes (Signed)
Patient states that she pulled a hangnail around 1 week ago and started noticing swelling and throbbing in the finger last Wednesday. Patient with swelling to skin surrounding nail on her right pinky.

## 2020-08-05 NOTE — Discharge Instructions (Addendum)
Take the Bactrim twice daily with a full glass of water for 7 days.  Soak your finger twice daily in warm water and Epsom salts.  Return for increased redness, swelling, or pain.

## 2020-12-05 ENCOUNTER — Ambulatory Visit (INDEPENDENT_AMBULATORY_CARE_PROVIDER_SITE_OTHER): Payer: Medicaid Other | Admitting: Dermatology

## 2020-12-05 ENCOUNTER — Other Ambulatory Visit: Payer: Self-pay

## 2020-12-05 DIAGNOSIS — L578 Other skin changes due to chronic exposure to nonionizing radiation: Secondary | ICD-10-CM

## 2020-12-05 DIAGNOSIS — Z1283 Encounter for screening for malignant neoplasm of skin: Secondary | ICD-10-CM | POA: Diagnosis not present

## 2020-12-05 DIAGNOSIS — D229 Melanocytic nevi, unspecified: Secondary | ICD-10-CM

## 2020-12-05 DIAGNOSIS — L700 Acne vulgaris: Secondary | ICD-10-CM | POA: Diagnosis not present

## 2020-12-05 DIAGNOSIS — D224 Melanocytic nevi of scalp and neck: Secondary | ICD-10-CM

## 2020-12-05 DIAGNOSIS — L732 Hidradenitis suppurativa: Secondary | ICD-10-CM

## 2020-12-05 DIAGNOSIS — D1800 Hemangioma unspecified site: Secondary | ICD-10-CM

## 2020-12-05 DIAGNOSIS — L814 Other melanin hyperpigmentation: Secondary | ICD-10-CM

## 2020-12-05 DIAGNOSIS — D220 Melanocytic nevi of lip: Secondary | ICD-10-CM

## 2020-12-05 DIAGNOSIS — L821 Other seborrheic keratosis: Secondary | ICD-10-CM

## 2020-12-05 DIAGNOSIS — D2239 Melanocytic nevi of other parts of face: Secondary | ICD-10-CM

## 2020-12-05 MED ORDER — BENZOYL PEROXIDE-ERYTHROMYCIN 5-3 % EX GEL: 1.0000 "application " | Freq: Two times a day (BID) | CUTANEOUS | 6 refills | Status: DC

## 2020-12-05 NOTE — Progress Notes (Signed)
New Patient Visit  Subjective  Angela Middleton is a 20 y.o. female who presents for the following: New Patient (Initial Visit) (Patient here today for tbse. She has some concerns with mole at right bottom of lip and spots at feet she would like to have checked. She also reports some abscess at multiple locations she would like checked. She has no personal or family history of skin cancer. ).  Patient here for full body skin exam and skin cancer screening.  The following portions of the chart were reviewed this encounter and updated as appropriate:   Tobacco  Allergies  Meds  Problems  Med Hx  Surg Hx  Fam Hx     Review of Systems:  No other skin or systemic complaints except as noted in HPI or Assessment and Plan.  Objective  Well appearing patient in no apparent distress; mood and affect are within normal limits.  A full examination was performed including scalp, head, eyes, ears, nose, lips, neck, chest, axillae, abdomen, back, buttocks, bilateral upper extremities, bilateral lower extremities, hands, feet, fingers, toes, fingernails, and toenails. All findings within normal limits unless otherwise noted below.  face Open comedones mild to moderate at face   bilateral axilla and groin Scarring and erythematous papules under arms and at groin  Dorsum of Nose Regular brown macule  left nose Regular brown macule   Right Lower Cutaneous Lip Regular brown papule   neck Regular brown macules    Assessment & Plan  Acne vulgaris face Chronic and persistent Patient states she has been using benzamycin gel and is happy with results.   Would like to continue with gel and needs refills.   Start Benzoyl peroxide erythromycin gel - apply 1 application 2 times daily to face.   benzoyl peroxide-erythromycin (BENZAMYCIN) gel - face Apply 1 application topically 2 (two) times daily. Apply to face. Can also use under arms and at groin for bumps  Hidradenitis  suppurativa bilateral axilla and groin  Hidradenitis Suppurativa is a chronic; persistent; non-curable, but treatable condition due to abnormal inflamed sweat glands in the body folds (axilla, inframammary, groin, medial thighs), causing recurrent painful draining cysts and scarring. It can be associated with severe scarring acne and cysts; also abscesses and scarring of scalp. The goal is control and prevention of flares, as it is not curable. Scars are permanent and can be thickened. Treatment may include daily use of topical medication and oral antibiotics.  Oral isotretinoin may also be helpful.  For more severe cases, Humira (a biologic injection) may be prescribed to decrease the inflammatory process and prevent flares.  When indicated, inflamed cysts may also be treated surgically.  Discussed treatment options with patient  Humira pamphlet given on hidradentis   Start Benzoyl Peroxide erythromycin gel to affected areas under arms and at groin twice daily.   Related Medications benzoyl peroxide-erythromycin (BENZAMYCIN) gel Apply 1 application topically 2 (two) times daily. Apply to face. Can also use under arms and at groin for bumps  Nevus (4) left nose; Dorsum of Nose; Right Lower Cutaneous Lip; neck Benign-appearing.  Observation.  Assured patient that these are not concerning. Call clinic for new or changing lesions.  Recommend daily use of broad spectrum spf 30+ sunscreen to sun-exposed areas.   Lentigines - Scattered tan macules - Due to sun exposure - Benign-appearing, observe - Recommend daily broad spectrum sunscreen SPF 30+ to sun-exposed areas, reapply every 2 hours as needed. - Call for any changes  Seborrheic Keratoses -  Stuck-on, waxy, tan-brown papules and/or plaques  - Benign-appearing - Discussed benign etiology and prognosis. - Observe - Call for any changes  Melanocytic Nevi - Tan-brown and/or pink-flesh-colored symmetric macules and papules - Benign  appearing on exam today - Observation - Call clinic for new or changing moles - Recommend daily use of broad spectrum spf 30+ sunscreen to sun-exposed areas.   Hemangiomas - Red papules - Discussed benign nature - Observe - Call for any changes  Actinic Damage - Chronic condition, secondary to cumulative UV/sun exposure - diffuse scaly erythematous macules with underlying dyspigmentation - Recommend daily broad spectrum sunscreen SPF 30+ to sun-exposed areas, reapply every 2 hours as needed.  - Staying in the shade or wearing long sleeves, sun glasses (UVA+UVB protection) and wide brim hats (4-inch brim around the entire circumference of the hat) are also recommended for sun protection.  - Call for new or changing lesions.  Skin cancer screening performed today.  Return in about 6 months (around 06/05/2021) for follow up on acne and hidradenitis .  IRuthell Rummage, CMA, am acting as scribe for Sarina Ser, MD. Documentation: I have reviewed the above documentation for accuracy and completeness, and I agree with the above.  Sarina Ser, MD

## 2020-12-05 NOTE — Patient Instructions (Signed)

## 2020-12-09 ENCOUNTER — Encounter: Payer: Self-pay | Admitting: Dermatology

## 2020-12-16 DIAGNOSIS — M25572 Pain in left ankle and joints of left foot: Secondary | ICD-10-CM | POA: Insufficient documentation

## 2020-12-21 IMAGING — CR DG CHEST 2V
1 series · 2 of 2 positions shown · non-contrast
Comparison: None.

CLINICAL DATA: Chest pain pressure

EXAM:
CHEST - 2 VIEW

[Series 1: dg chest 2 view · 0.14mm/px · 2 of 2 slices shown]
[im 1/2]
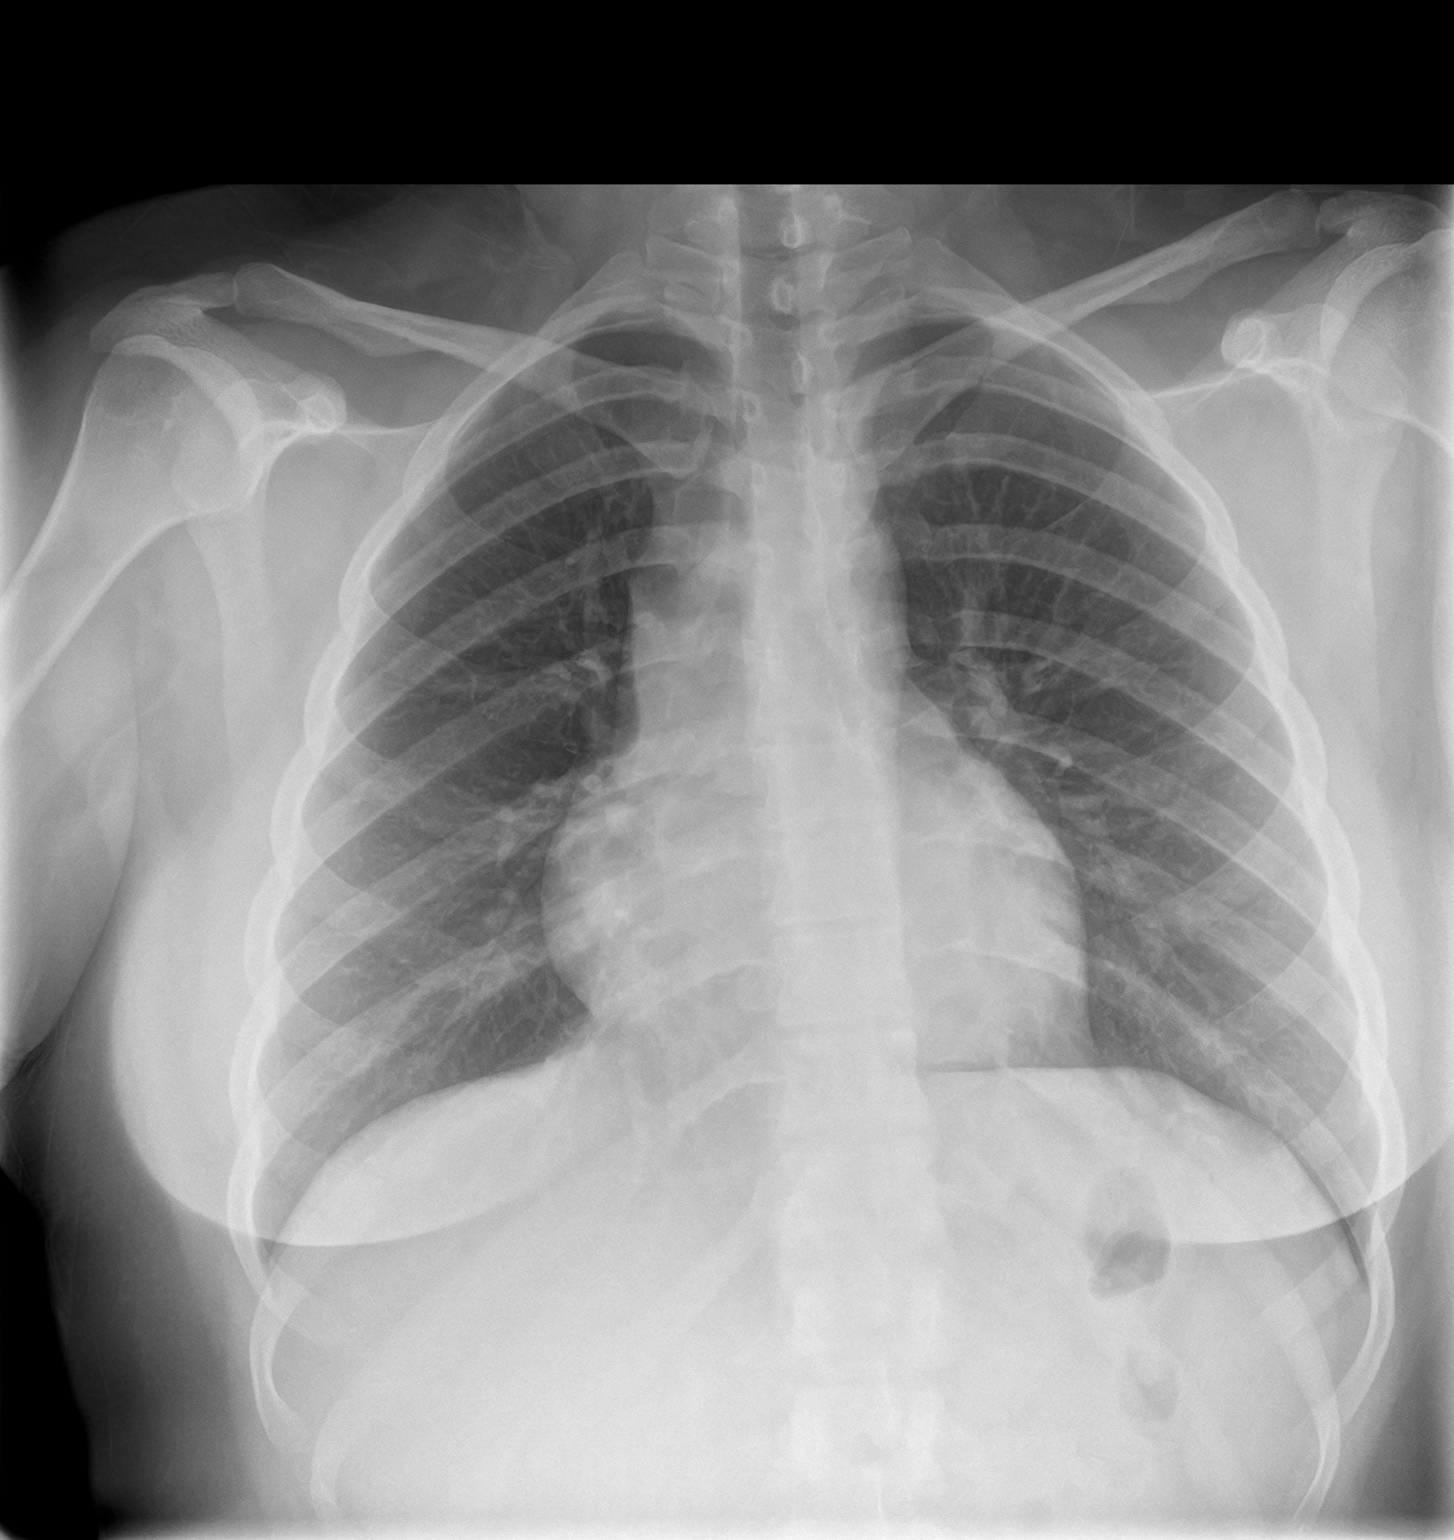
[im 2/2]
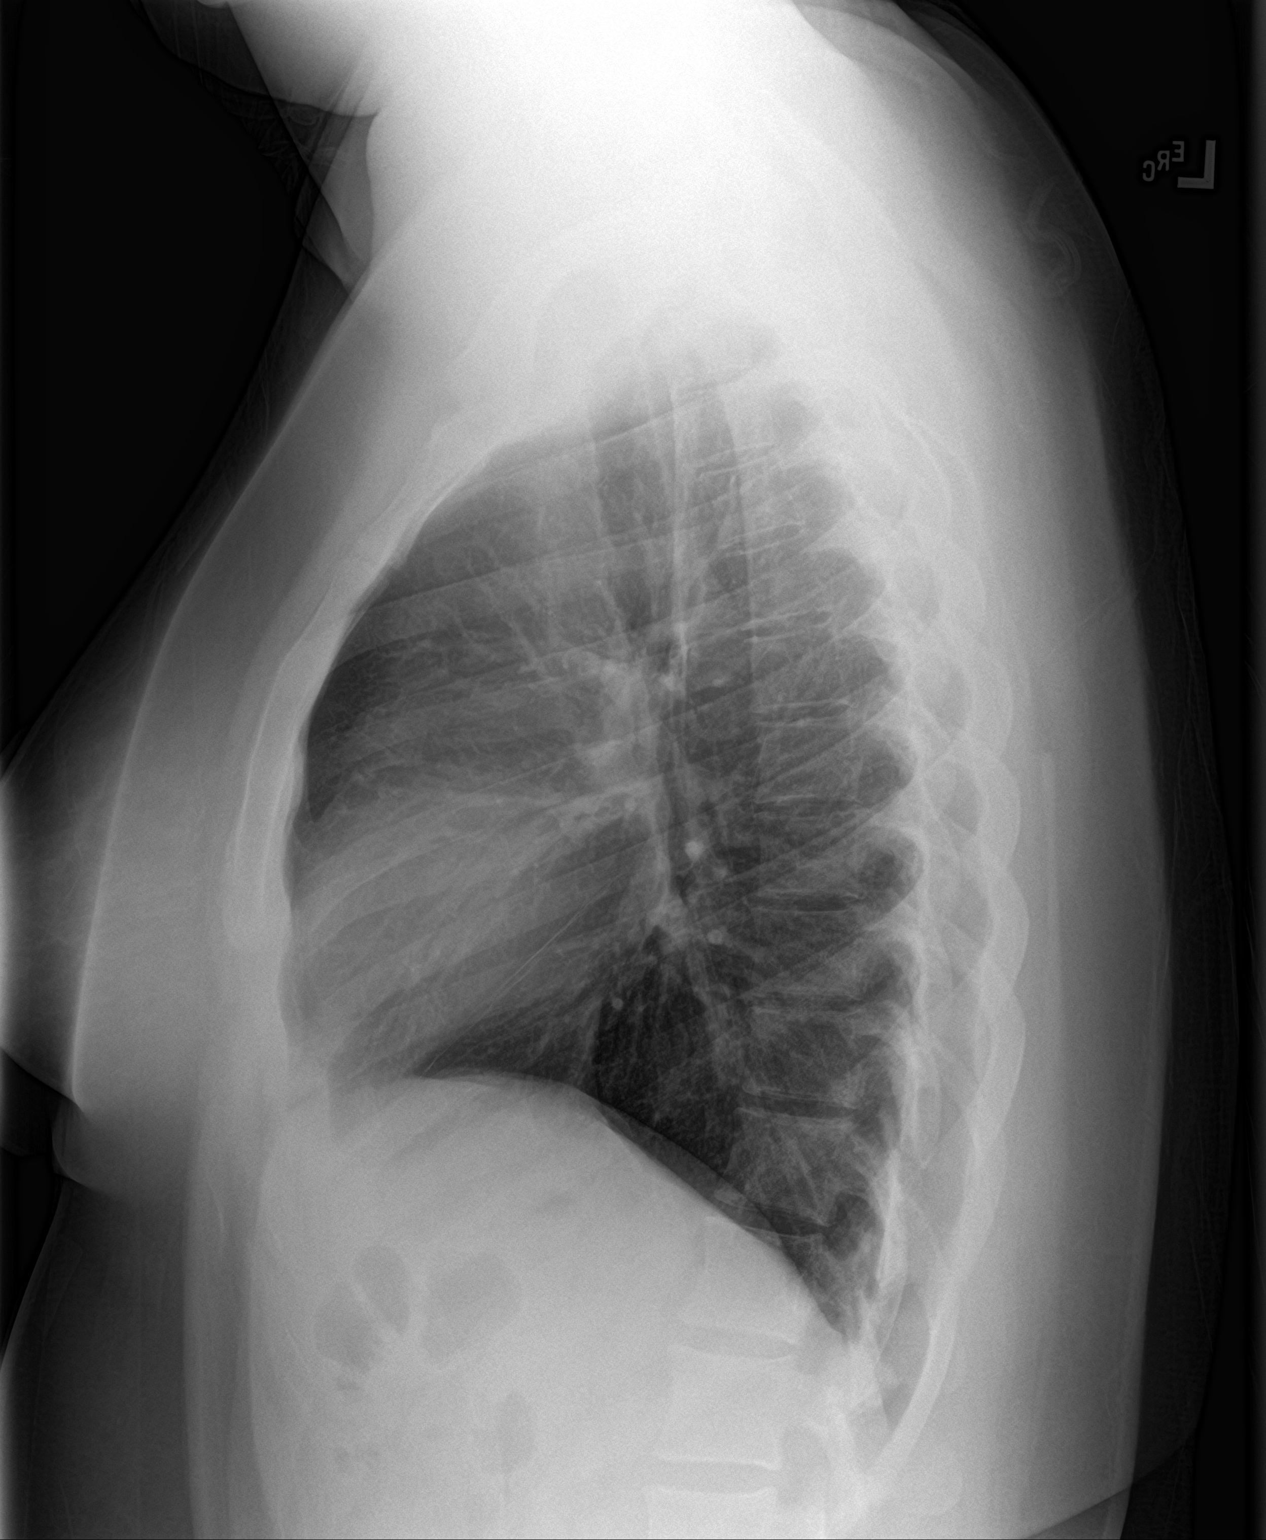

[2 of 2 positions shown; findings below may reference images not displayed]

FINDINGS: The heart size and mediastinal contours are within normal limits.
Both lungs are clear. The visualized skeletal structures are
unremarkable.
IMPRESSION: No active cardiopulmonary disease.

## 2021-01-30 ENCOUNTER — Ambulatory Visit: Payer: Self-pay

## 2021-03-19 ENCOUNTER — Other Ambulatory Visit: Payer: Self-pay

## 2021-03-19 ENCOUNTER — Ambulatory Visit
Admission: RE | Admit: 2021-03-19 | Discharge: 2021-03-19 | Disposition: A | Discharge status: Home / Self Care | Payer: Medicaid Other | Source: Ambulatory Visit | Attending: Medical Oncology | Admitting: Medical Oncology

## 2021-03-19 VITALS — BP 121/81 | HR 99 | Temp 98.2°F | Resp 16

## 2021-03-19 DIAGNOSIS — K047 Periapical abscess without sinus: Secondary | ICD-10-CM

## 2021-03-19 DIAGNOSIS — J069 Acute upper respiratory infection, unspecified: Secondary | ICD-10-CM | POA: Diagnosis not present

## 2021-03-19 MED ORDER — FLUTICASONE PROPIONATE 50 MCG/ACT NA SUSP: 2.0000 | Freq: Every day | NASAL | 0 refills | Status: DC

## 2021-03-19 MED ORDER — BENZONATATE 100 MG PO CAPS: 100.0000 mg | ORAL_CAPSULE | Freq: Three times a day (TID) | ORAL | 0 refills | Status: DC

## 2021-03-19 MED ORDER — ALBUTEROL SULFATE HFA 108 (90 BASE) MCG/ACT IN AERS: 1.0000 | INHALATION_SPRAY | Freq: Four times a day (QID) | RESPIRATORY_TRACT | 0 refills | Status: DC | PRN

## 2021-03-19 NOTE — ED Provider Notes (Signed)
Angela Middleton    CSN: [id number] Arrival date & time: 03/19/21  1253      History   Chief Complaint Chief Complaint  Patient presents with   Sore Throat   Cough   Dental Pain    HPI Angela Middleton is a 21 y.o. female.   HPI  Sore Throat: Pt reports with symptoms of cough, sore throat, mouth pain for the past 8 days. Reports that she feels herself wheezing at night. The dental pain was reported an a bump under her left back molar. This has improved greatly. Was seen 5 days ago at [facility] where she reported that she had negative testing for strep, flu and COVID-19. She was given Amoxil for symptoms which has not been helpful.   Past Medical History:  Diagnosis Date   Asthma     There are no problems to display for this patient.   Past Surgical History:  Procedure Laterality Date   NO PAST SURGERIES      OB History     Gravida  2   Para      Term      Preterm      AB  1   Living         SAB  1   IAB      Ectopic      Multiple      Live Births               Home Medications    Prior to Admission medications   Medication Sig Start Date End Date Taking? Authorizing Provider  albuterol (VENTOLIN HFA) 108 (90 Base) MCG/ACT inhaler Inhale 1-2 puffs into the lungs every 6 (six) hours as needed for wheezing or shortness of breath. 03/19/21  Yes Yohannes Waibel M, PA-C  benzonatate (TESSALON) 100 MG capsule Take 1 capsule (100 mg total) by mouth every 8 (eight) hours. 03/19/21  Yes Rhondalyn Clingan M, PA-C  benzoyl peroxide-erythromycin [facility]) gel Apply 1 application topically 2 (two) times daily. Apply to face. Can also use under arms and at groin for bumps 12/05/20   Ralene Bathe, MD  cetirizine (ZYRTEC) 10 MG tablet Take 10 mg by mouth daily.  12/14/19  [provider]  fluticasone (FLONASE) 50 MCG/ACT nasal spray Place 2 sprays into both nostrils daily. 02/10/18 12/14/19  Lorin Picket, PA-C  SPRINTEC 28 0.25-35 MG-MCG  tablet Take 1 tablet by mouth daily. 10/05/19 03/06/20  [provider]    Family History Family History  Problem Relation Age of Onset   Asthma Mother    Healthy Mother    Diabetes Father    Healthy Father    Hypertension Father    High Cholesterol Father    Diabetes Paternal Grandfather    Hypertension Paternal Grandfather    Diabetes Paternal Grandmother    Hypertension Paternal Grandmother    Diabetes Maternal Uncle    Hypertension Maternal Uncle     Social History Social History   Tobacco Use   Smoking status: Never   Smokeless tobacco: Never  Vaping Use   Vaping Use: Every day   Substances: Nicotine, Flavoring  Substance Use Topics   Alcohol use: Never   Drug use: Yes    Types: Marijuana    Comment: 4-5 times weekly     Allergies   Shrimp [shellfish allergy]   Review of Systems Review of Systems  As stated above in HPI Physical Exam Triage Vital Signs ED Triage Vitals  Enc  Vitals Group     BP 03/19/21 1327 121/81     Pulse Rate 03/19/21 1327 99     Resp 03/19/21 1327 16     Temp 03/19/21 1327 98.2 F (36.8 C)     Temp Source 03/19/21 1327 Oral     SpO2 03/19/21 1327 98 %     Weight --      Height --      Head Circumference --      Peak Flow --      Pain Score 03/19/21 1338 5     Pain Loc --      Pain Edu? --      Excl. in [place]? --    No data found.  Updated Vital Signs BP 121/81 (BP Location: Left Arm)    Pulse 99    Temp 98.2 F (36.8 C) (Oral)    Resp 16    SpO2 98%   Physical Exam Vitals and nursing note reviewed.  Constitutional:      General: She is not in acute distress.    Appearance: She is well-developed. She is not ill-appearing, toxic-appearing or diaphoretic.  HENT:     Head: Normocephalic and atraumatic.     Right Ear: No middle ear effusion. Tympanic membrane is not erythematous.     Left Ear:  No middle ear effusion. Tympanic membrane is not erythematous.     Nose: No congestion or rhinorrhea.     Mouth/Throat:      Mouth: Mucous membranes are moist.     Pharynx: Oropharynx is clear. Uvula midline. No oropharyngeal exudate, posterior oropharyngeal erythema or uvula swelling.     Tonsils: No tonsillar exudate or tonsillar abscesses.  Eyes:     Conjunctiva/sclera: Conjunctivae normal.     Pupils: Pupils are equal, round, and reactive to light.  Cardiovascular:     Rate and Rhythm: Normal rate and regular rhythm.     Heart sounds: Normal heart sounds.  Pulmonary:     Effort: Pulmonary effort is normal.     Breath sounds: Normal breath sounds. No wheezing.  Musculoskeletal:     Cervical back: Normal range of motion and neck supple.  Lymphadenopathy:     Cervical: No cervical adenopathy.  Skin:    General: Skin is warm.  Neurological:     General: No focal deficit present.     Mental Status: She is alert and oriented to person, place, and time.     UC Treatments / Results  Labs (all labs ordered are listed, but only abnormal results are displayed) Labs Reviewed - No data to display  EKG   Radiology No results found.  Procedures Procedures (including critical care time)  Medications Ordered in UC Medications - No data to display  Initial Impression / Assessment and Plan / UC Course  I have reviewed the triage vital signs and the nursing notes.  Pertinent labs & imaging results that were available during my care of the patient were reviewed by me and considered in my medical decision making (see chart for details).     New. Appears viral in nature. The dental infection appears to be clearing with the amoxil and she was advised to continue this. Adding on flonase, albuterol, tessalon.    Final Clinical Impressions(s) / UC Diagnoses   Final diagnoses:  Viral URI with cough  Dental infection   Discharge Instructions   None    ED Prescriptions     Medication Sig Dispense Auth. Provider  benzonatate (TESSALON) 100 MG capsule Take 1 capsule (100 mg total) by mouth every 8  (eight) hours. 21 capsule Tijuana Scheidegger M, PA-C   albuterol (VENTOLIN HFA) 108 (90 Base) MCG/ACT inhaler Inhale 1-2 puffs into the lungs every 6 (six) hours as needed for wheezing or shortness of breath. 1 each Hughie Closs, PA-C      PDMP not reviewed this encounter.   Hughie Closs, [place] 03/19/21 1422

## 2021-03-19 NOTE — ED Triage Notes (Signed)
Pt is here with burning in chest while coughing, sore throat, and mouth pain x 8 days. Was seen 5 days ago at [facility] and tested for strep, flu and covid, all of which were negative. Pt was given Amoxicillin which she states has not helped and the other symptomatic medications prescribed, were not approved by Medicaid.

## 2021-04-16 ENCOUNTER — Ambulatory Visit (INDEPENDENT_AMBULATORY_CARE_PROVIDER_SITE_OTHER): Payer: Medicaid Other | Admitting: Dermatology

## 2021-04-16 ENCOUNTER — Other Ambulatory Visit: Payer: Self-pay

## 2021-04-16 DIAGNOSIS — L732 Hidradenitis suppurativa: Secondary | ICD-10-CM

## 2021-04-16 DIAGNOSIS — L700 Acne vulgaris: Secondary | ICD-10-CM

## 2021-04-16 MED ORDER — DOXYCYCLINE MONOHYDRATE 100 MG PO CAPS: 100.0000 mg | ORAL_CAPSULE | ORAL | 4 refills | Status: DC

## 2021-04-16 MED ORDER — TRETINOIN 0.025 % EX CREA: TOPICAL_CREAM | Freq: Every day | CUTANEOUS | 4 refills | Status: DC

## 2021-04-16 NOTE — Patient Instructions (Addendum)
Doxycycline should be taken with food to prevent nausea. Do not lay down for 30 minutes after taking. Be cautious with sun exposure and use good sun protection while on this medication. Pregnant women should not take this medication.  ? ?Topical retinoid medications like tretinoin/Retin-A, adapalene/Differin, tazarotene/Fabior, and Epiduo/Epiduo Forte can cause dryness and irritation when first started. Only apply a pea-sized amount to the entire affected area. Avoid applying it around the eyes, edges of mouth and creases at the nose. If you experience irritation, use a good moisturizer first and/or apply the medicine less often. If you are doing well with the medicine, you can increase how often you use it until you are applying every night. Be careful with sun protection while using this medication as it can make you sensitive to the sun. This medicine should not be used by pregnant women.  ? ?Vanicream deodorant - given info in brochure  ? ?If You Need Anything After Your Visit ? ?If you have any questions or concerns for your doctor, please call our main line at [phone] and press option 4 to reach your doctor's medical assistant. If no one answers, please leave a voicemail as directed and we will return your call as soon as possible. Messages left after 4 pm will be answered the following business day.  ? ?You may also send [place] a message via MyChart. We typically respond to MyChart messages within 1-2 business days. ? ?For prescription refills, please ask your pharmacy to contact our office. Our fax number is [phone]. ? ?If you have an urgent issue when the clinic is closed that cannot wait until the next business day, you can page your doctor at the number below.   ? ?Please note that while we do our best to be available for urgent issues outside of office hours, we are not available 24/7.  ? ?If you have an urgent issue and are unable to reach [place], you may choose to seek medical care at your doctor's  office, retail clinic, urgent care center, or emergency room. ? ?If you have a medical emergency, please immediately call 911 or go to the emergency department. ? ?Pager Numbers ? ?- Dr. Nehemiah Massed: [phone] ? ?- Dr. Laurence Ferrari: [phone] ? ?- Dr. Nicole Kindred: [phone] ? ?In the event of inclement weather, please call our main line at [phone] for an update on the status of any delays or closures. ? ?Dermatology Medication Tips: ?Please keep the boxes that topical medications come in in order to help keep track of the instructions about where and how to use these. Pharmacies typically print the medication instructions only on the boxes and not directly on the medication tubes.  ? ?If your medication is too expensive, please contact our office at [phone] option 4 or send [place] a message through [place].  ? ?We are unable to tell what your co-pay for medications will be in advance as this is different depending on your insurance coverage. However, we may be able to find a substitute medication at lower cost or fill out paperwork to get insurance to cover a needed medication.  ? ?If a prior authorization is required to get your medication covered by your insurance company, please allow [place] 1-2 business days to complete this process. ? ?Drug prices often vary depending on where the prescription is filled and some pharmacies may offer cheaper prices. ? ?The website www.goodrx.com contains coupons for medications through different pharmacies. The prices here do not account for what the cost may be  with help from insurance (it may be cheaper with your insurance), but the website can give you the price if you did not use any insurance.  ?- You can print the associated coupon and take it with your prescription to the pharmacy.  ?- You may also stop by our office during regular business hours and pick up a GoodRx coupon card.  ?- If you need your prescription sent electronically to a different pharmacy, notify our office  through [facility] or by phone at [phone] option 4. ? ? ? ? ?Si Usted Necesita Algo Despu?s de Su Visita ? ?Tambi?n puede enviarnos un mensaje a trav?s de MyChart. Por lo general respondemos a los mensajes de MyChart en el transcurso de 1 a 2 d?as h?biles. ? ?Para renovar recetas, por favor pida a su farmacia que se ponga en contacto con nuestra oficina. Nuestro n?mero de fax es el [phone]. ? ?Si tiene un asunto urgente cuando la cl?nica est? cerrada y que no puede esperar hasta el siguiente d?a h?bil, puede llamar/localizar a su doctor(a) al n?mero que aparece a continuaci?n.  ? ?Por favor, tenga en cuenta que aunque hacemos todo lo posible para estar disponibles para asuntos urgentes fuera del horario de oficina, no estamos disponibles las 24 horas del d?a, los 7 d?as de la semana.  ? ?Si tiene un problema urgente y no puede comunicarse con nosotros, puede optar por buscar atenci?n m?dica  en el consultorio de su doctor(a), en una cl?nica privada, en un centro de atenci?n urgente o en una sala de emergencias. ? ?Si tiene Engineer, maintenance (IT) m?dica, por favor llame inmediatamente al 911 o vaya a la sala de emergencias. ? ?N?meros de b?per ? ?- Dr. Nehemiah Massed: [phone] ? ?- Dra. Moye: [phone] ? ?- Dra. Nicole Kindred: [phone] ? ?En caso de inclemencias del tiempo, por favor llame a nuestra l?nea principal al [phone] para una actualizaci?n sobre el estado de cualquier retraso o cierre. ? ?Consejos para la medicaci?n en dermatolog?a: ?Por favor, guarde las cajas en las que vienen los medicamentos de uso t?pico para ayudarle a seguir las instrucciones sobre d?nde y c?mo usarlos. Las farmacias generalmente imprimen las instrucciones del medicamento s?lo en las cajas y no directamente en los tubos del [place].  ? ?Si su medicamento es muy caro, por favor, p?ngase en contacto con Zigmund Daniel llamando al [phone] y presione la opci?n 4 o env?enos un mensaje a trav?s de MyChart.  ? ?No  podemos decirle cu?l ser? su copago por los medicamentos por adelantado ya que esto es diferente dependiendo de la cobertura de su seguro. Sin embargo, es posible que podamos encontrar un medicamento sustituto a Electrical engineer un formulario para que el seguro cubra el medicamento que se considera necesario.  ? ?Si se requiere Ardelia Mems autorizaci?n previa para que su compa??a de seguros [place] su medicamento, por favor perm?tanos de 1 a 2 d?as h?biles para completar este proceso. ? ?Los precios de los medicamentos var?an con frecuencia dependiendo del Environmental consultant de d?nde se surte la receta y alguna farmacias pueden ofrecer precios m?s baratos. ? ?El sitio web www.goodrx.com tiene cupones para medicamentos de Airline pilot. Los precios aqu? no tienen en cuenta lo que podr?a costar con la ayuda del seguro (puede ser m?s barato con su seguro), pero el sitio web puede darle el precio si no utiliz? ning?n seguro.  ?- Puede imprimir el cup?n correspondiente y llevarlo con su receta a la farmacia.  ?- Tambi?n puede pasar por nuestra oficina durante  el horario de atenci?n regular y Charity fundraiser una tarjeta de cupones de GoodRx.  ?- Si necesita que su receta se env?e electr?nicamente a Chiropodist, informe a nuestra oficina a trav?s de MyChart de [place] o por tel?fono llamando al [phone] y presione la opci?n 4. ? ?

## 2021-04-16 NOTE — Progress Notes (Signed)
? ?Follow-Up Visit ?  ?Subjective  ?Angela Middleton is a 21 y.o. female who presents for the following: Follow-up (Patient here today for flare due to hs that started a few days ago. Patient reports at left axilla is very painful. ). ?She also complains of acne and would like treatment for this. ? ?The following portions of the chart were reviewed this encounter and updated as appropriate:  Tobacco  Allergies  Meds  Problems  Med Hx  Surg Hx  Fam Hx   ?  ?Review of Systems: No other skin or systemic complaints except as noted in HPI or Assessment and Plan. ? ?Objective  ?Well appearing patient in no apparent distress; mood and affect are within normal limits. ? ?A focused examination was performed including bilateral axilla, bilateral inframammary, face. Relevant physical exam findings are noted in the Assessment and Plan. ? ?bilateral axilla , groin ?Scarring at left and right axillary, tender red papule at right axilla and left axilla, tender red papule at right inframammary.  ?  ? ?Head - Anterior (Face) ?Open comedones at upper lip ? ? ?Assessment & Plan  ?Hidradenitis suppurativa ?bilateral axilla , groin ?Chronic and persistent condition with duration or expected duration over one year. Condition is bothersome/symptomatic for patient. Currently flared. ?Hidradenitis Suppurativa is a chronic; persistent; non-curable, but treatable condition due to abnormal inflamed sweat glands in the body folds (axilla, inframammary, groin, medial thighs), causing recurrent painful draining cysts and scarring. It can be associated with severe scarring acne and cysts; also abscesses and scarring of scalp. The goal is control and prevention of flares, as it is not curable. Scars are permanent and can be thickened. Treatment may include daily use of topical medication and oral antibiotics.  Oral isotretinoin may also be helpful.  For more severe cases, Humira (a biologic injection) may be prescribed to decrease the  inflammatory process and prevent flares.  When indicated, inflamed cysts may also be treated surgically. ? ?Patient denies drainage  ? ?May continue benzoyl peroxide erythromycin gel if not irritating ?Start Doxycycline 100 mg capsule - take 1 capsule by mouth twice daily for 1 week, after 1 week begin taking 1 capsule by mouth daily.  ?May also start tretinoin 0.025% cream nightly ?Doxycycline should be taken with food to prevent nausea. Do not lay down for 30 minutes after taking. Be cautious with sun exposure and use good sun protection while on this medication. Pregnant women should not take this medication.  ? ?Will consider other treatments if not improved at next follow up such as isotretinoin or Humira or others. ?Discussed that if becomes fluctuant, can return for incision and drainage.  It is currently not fluctuant and therefore I&D will not likely be fruitful. ? ?doxycycline (MONODOX) 100 MG capsule - bilateral axilla , groin ?Take 1 capsule (100 mg total) by mouth See admin instructions. Take twice daily for 1 week with food. After 1 week take 1 capsule by mouth daily. ?Related Medications ?benzoyl peroxide-erythromycin (BENZAMYCIN) gel ?Apply 1 application topically 2 (two) times daily. Apply to face. Can also use under arms and at groin for bumps ? ?Acne vulgaris ?Head - Anterior (Face) ?Start Tretinoin 0.025% cream - apply to face topically at night.  ?Chronic and persistent condition with duration or expected duration over one year. Condition is symptomatic / bothersome to patient. Not to goal. ? ?Topical retinoid medications like tretinoin/Retin-A, adapalene/Differin, tazarotene/Fabior, and Epiduo/Epiduo Forte can cause dryness and irritation when first started. Only apply a pea-sized amount to  the entire affected area. Avoid applying it around the eyes, edges of mouth and creases at the nose. If you experience irritation, use a good moisturizer first and/or apply the medicine less often. If you  are doing well with the medicine, you can increase how often you use it until you are applying every night. Be careful with sun protection while using this medication as it can make you sensitive to the sun. This medicine should not be used by pregnant women.  ? ?tretinoin (RETIN-A) 0.025 % cream - Head - Anterior (Face) ?Apply topically at bedtime. Apply to face ?Related Medications ?benzoyl peroxide-erythromycin (BENZAMYCIN) gel ?Apply 1 application topically 2 (two) times daily. Apply to face. Can also use under arms and at groin for bumps ? ?Return for 3 - 4 month hs and acne follow up. ?I, Ruthell Rummage, CMA, am acting as scribe for Sarina Ser, MD. ?Documentation: I have reviewed the above documentation for accuracy and completeness, and I agree with the above. ? ?Sarina Ser, MD ? ?

## 2021-04-17 ENCOUNTER — Other Ambulatory Visit: Payer: Self-pay

## 2021-04-17 MED ORDER — RETIN-A 0.025 % EX CREA: TOPICAL_CREAM | Freq: Every day | CUTANEOUS | 4 refills | Status: DC

## 2021-04-17 NOTE — Progress Notes (Signed)
Tretinoin sent in a brand name for insurance purposes. aw ?

## 2021-04-20 ENCOUNTER — Encounter: Payer: Self-pay | Admitting: Dermatology

## 2021-04-22 ENCOUNTER — Other Ambulatory Visit: Payer: Self-pay

## 2021-04-22 MED ORDER — DOXYCYCLINE MONOHYDRATE 100 MG PO CAPS: 100.0000 mg | ORAL_CAPSULE | Freq: Every day | ORAL | 0 refills | Status: DC

## 2021-04-22 MED ORDER — DOXYCYCLINE MONOHYDRATE 100 MG PO CAPS: 100.0000 mg | ORAL_CAPSULE | Freq: Two times a day (BID) | ORAL | 0 refills | Status: AC

## 2021-04-22 NOTE — Progress Notes (Signed)
New RX for Doxycycline - broke up "loading dose" of taking two daily then maintenance dose of 1 daily. Insurance is giving a hard time covered prescription. aw ?

## 2021-05-21 ENCOUNTER — Ambulatory Visit: Payer: Medicaid Other | Admitting: Dermatology

## 2021-05-29 ENCOUNTER — Ambulatory Visit
Admission: EM | Admit: 2021-05-29 | Discharge: 2021-05-29 | Disposition: A | Discharge status: Home / Self Care | Payer: Medicaid Other | Attending: Physician Assistant | Admitting: Physician Assistant

## 2021-05-29 DIAGNOSIS — L731 Pseudofolliculitis barbae: Secondary | ICD-10-CM | POA: Diagnosis not present

## 2021-05-29 DIAGNOSIS — Z113 Encounter for screening for infections with a predominantly sexual mode of transmission: Secondary | ICD-10-CM | POA: Insufficient documentation

## 2021-05-29 DIAGNOSIS — Z3202 Encounter for pregnancy test, result negative: Secondary | ICD-10-CM | POA: Insufficient documentation

## 2021-05-29 LAB — PREGNANCY, URINE: Preg Test, Ur: NEGATIVE

## 2021-05-29 MED ORDER — MUPIROCIN 2 % EX OINT: 1.0000 "application " | TOPICAL_OINTMENT | Freq: Three times a day (TID) | CUTANEOUS | 0 refills | Status: DC

## 2021-05-29 NOTE — Discharge Instructions (Signed)
-  We will call if your pregnancy test is positive.  Will be on MyChart. ?- Your results of your sexually transmitted infection test will also be available on MyChart in the next couple of days.  If anything is positive, we will call you and recommend treatment. ?- The bump that you are concerned about looks like a small ingrown hair or possibly related to your at bedtime.  I sent an ointment to the pharmacy.  If it enlarges, you may need an oral antibiotic.  I am not concerned about any herpes or other STI related to this bump. ?- Wear barrier protection and use contraceptives if not wanting to be pregnant at this time. ?

## 2021-05-29 NOTE — ED Triage Notes (Signed)
Pt presents for STD testing and pregnancy testing d/t unfaithful partner.  Has one bump in vaginal area but says it may be one of the normal bumps she gets from hidradenitis suppurativa.   ?

## 2021-05-29 NOTE — ED Provider Notes (Signed)
?[place] ? ? ? ?CSN: [id number] ?Arrival date & time: 05/29/21  1306 ? ? ?  ? ?History   ?Chief Complaint ?Chief Complaint  ?Patient presents with  ? SEXUALLY TRANSMITTED DISEASE  ? ? ?HPI ?Angela Middleton is a 21 y.o. female presenting for STI screening.  Patient says she recently found out that her partner had been active with another partner.  She says they do not use any protection and she does not take any birth control pills or other methods of contraception.  Last menstrual period was about a month ago so she would like a pregnancy test.  She does report irregular menstrual periods.  Patient also reports that she has a bump on her labia.  She is not sure that could be related to something sexually transmitted or her at bedtime.  Reports she gets bumps in this area related to at bedtime and had 1 there but a month ago.  She was given antibiotics.  She says she has been applying topical benzoyl to this bump and it has not gone away.  She says its not really painful.  She denies any associated vaginal discharge, itching or odor.  No abdominal or pelvic pain.  No other complaints. ? ?HPI ? ?Past Medical History:  ?Diagnosis Date  ? Asthma   ? ? ?There are no problems to display for this patient. ? ? ?Past Surgical History:  ?Procedure Laterality Date  ? NO PAST SURGERIES    ? ? ?OB History   ? ? Gravida  ?2  ? Para  ?   ? Term  ?   ? Preterm  ?   ? AB  ?1  ? Living  ?   ?  ? ? SAB  ?1  ? IAB  ?   ? Ectopic  ?   ? Multiple  ?   ? Live Births  ?   ?   ?  ?  ? ? ? ?Home Medications   ? ?Prior to Admission medications   ?Medication Sig Start Date End Date Taking? Authorizing Provider  ?albuterol (VENTOLIN HFA) 108 (90 Base) MCG/ACT inhaler Inhale 1-2 puffs into the lungs every 6 (six) hours as needed for wheezing or shortness of breath. 03/19/21  Yes Covington, Sarah M, PA-C  ?benzoyl peroxide-erythromycin [facility]) gel Apply 1 application topically 2 (two) times daily. Apply to face. Can also use  under arms and at groin for bumps 12/05/20  Yes Ralene Bathe, MD  ?fluticasone [facility]) 50 MCG/ACT nasal spray Place 2 sprays into both nostrils daily. 03/19/21  Yes Covington, Sarah M, PA-C  ?mupirocin ointment (BACTROBAN) 2 % Apply 1 application. topically 3 (three) times daily. 05/29/21  Yes Laurene Footman B, PA-C  ?RETIN-A 0.025 % cream Apply topically at bedtime. 04/17/21  Yes Ralene Bathe, MD  ?doxycycline (MONODOX) 100 MG capsule Take 1 capsule (100 mg total) by mouth daily. With food and water. Do not lay down for 30 minutes after taking. 04/22/21   Ralene Bathe, MD  ?cetirizine (ZYRTEC) 10 MG tablet Take 10 mg by mouth daily.  12/14/19  [provider]  ?[place] 28 0.25-35 MG-MCG tablet Take 1 tablet by mouth daily. 10/05/19 03/06/20  [provider]  ? ? ?Family History ?Family History  ?Problem Relation Age of Onset  ? Asthma Mother   ? Healthy Mother   ? Diabetes Father   ? Healthy Father   ? Hypertension Father   ? High Cholesterol Father   ?  Diabetes Paternal Grandfather   ? Hypertension Paternal Grandfather   ? Diabetes Paternal Grandmother   ? Hypertension Paternal Grandmother   ? Diabetes Maternal Uncle   ? Hypertension Maternal Uncle   ? ? ?Social History ?Social History  ? ?Tobacco Use  ? Smoking status: Never  ? Smokeless tobacco: Never  ?Vaping Use  ? Vaping Use: Some days  ? Substances: Nicotine, Flavoring  ?Substance Use Topics  ? Alcohol use: Yes  ?  Comment: socially  ? Drug use: Yes  ?  Types: Marijuana  ?  Comment: 4-5 times weekly  ? ? ? ?Allergies   ?Shrimp [shellfish allergy] ? ? ?Review of Systems ?Review of Systems  ?Constitutional:  Negative for fatigue and fever.  ?Gastrointestinal:  Negative for abdominal pain, nausea and vomiting.  ?Genitourinary:  Positive for genital sores. Negative for dysuria, flank pain, frequency, hematuria, urgency, vaginal bleeding, vaginal discharge and vaginal pain.  ?Musculoskeletal:  Negative for back pain.  ?Skin:  Negative  for rash.  ? ? ?Physical Exam ?Triage Vital Signs ?ED Triage Vitals  ?Enc Vitals Group  ?   BP   ?   Pulse   ?   Resp   ?   Temp   ?   Temp src   ?   SpO2   ?   Weight   ?   Height   ?   Head Circumference   ?   Peak Flow   ?   Pain Score   ?   Pain Loc   ?   Pain Edu?   ?   Excl. in [place]?   ? ?No data found. ? ?Updated Vital Signs ?BP 109/68 (BP Location: Left Arm)   Pulse 76   Temp 98.6 ?F (37 ?C) (Oral)   Resp 18   LMP  (Approximate) Comment: around a month ago  SpO2 98%  ?   ? ?Physical Exam ?Vitals and nursing note reviewed.  ?Constitutional:   ?   General: She is not in acute distress. ?   Appearance: Normal appearance. She is not ill-appearing or toxic-appearing.  ?HENT:  ?   Head: Normocephalic and atraumatic.  ?Eyes:  ?   General: No scleral icterus.    ?   Right eye: No discharge.     ?   Left eye: No discharge.  ?   Conjunctiva/sclera: Conjunctivae normal.  ?Cardiovascular:  ?   Rate and Rhythm: Normal rate and regular rhythm.  ?   Heart sounds: Normal heart sounds.  ?Pulmonary:  ?   Effort: Pulmonary effort is normal. No respiratory distress.  ?   Breath sounds: Normal breath sounds.  ?Abdominal:  ?   Palpations: Abdomen is soft.  ?   Tenderness: There is no abdominal tenderness.  ?Genitourinary: ?   Exam position: Lithotomy position.  ?   Labia:     ?   Right: Lesion (small erythematous non tender papule right labia majora) present.   ?Musculoskeletal:  ?   Cervical back: Neck supple.  ?Skin: ?   General: Skin is dry.  ?Neurological:  ?   General: No focal deficit present.  ?   Mental Status: She is alert. Mental status is at baseline.  ?   Motor: No weakness.  ?   Gait: Gait normal.  ?Psychiatric:     ?   Mood and Affect: Mood normal.     ?   Behavior: Behavior normal.     ?   Thought Content: Thought  content normal.  ? ? ? ?UC Treatments / Results  ?Labs ?(all labs ordered are listed, but only abnormal results are displayed) ?Labs Reviewed  ?PREGNANCY, URINE  ?CERVICOVAGINAL ANCILLARY ONLY   ? ? ?EKG ? ? ?Radiology ?No results found. ? ?Procedures ?Procedures (including critical care time) ? ?Medications Ordered in UC ?Medications - No data to display ? ?Initial Impression / Assessment and Plan / UC Course  ?I have reviewed the triage vital signs and the nursing notes. ? ?Pertinent labs & imaging results that were available during my care of the patient were reviewed by me and considered in my medical decision making (see chart for details). ? ?21 year old female presenting for STI screening, pregnancy test and evaluation of bump on labia that is been present for couple weeks.  Reports her partner recently had another partner.  She denies any vaginal discharge, itching or odor, abdominal or pelvic pain.  Patient not interested in blood testing for STIs.  I did discuss that with her.  Patient elected to perform self swab.  GC/chlamydia/trichomonas test obtained.  Advised patient on x-ray results.  Pregnancy test performed.  Negative.  Patient's papule on labia does not appear to be consistent with STI.  Suspect possible ingrown hair or related to at bedtime.  Mupirocin ointment prescribed.  Advised close monitoring and for any worsening to return or see PCP/dermatologist as she may need oral antibiotics. ? ? ?Final Clinical Impressions(s) / UC Diagnoses  ? ?Final diagnoses:  ?Routine screening for STI (sexually transmitted infection)  ?Pregnancy examination or test, negative result  ?Ingrown hair  ? ? ? ?Discharge Instructions   ? ?  ?-We will call if your pregnancy test is positive.  Will be on MyChart. ?- Your results of your sexually transmitted infection test will also be available on MyChart in the next couple of days.  If anything is positive, we will call you and recommend treatment. ?- The bump that you are concerned about looks like a small ingrown hair or possibly related to your at bedtime.  I sent an ointment to the pharmacy.  If it enlarges, you may need an oral antibiotic.  I am not  concerned about any herpes or other STI related to this bump. ?- Wear barrier protection and use contraceptives if not wanting to be pregnant at this time. ? ? ? ? ?ED Prescriptions   ? ? Medication Sig Dispense Auth. Provid

## 2021-05-30 ENCOUNTER — Ambulatory Visit: Payer: Self-pay

## 2021-05-30 LAB — CERVICOVAGINAL ANCILLARY ONLY
Chlamydia: NEGATIVE
Comment: NEGATIVE
Comment: NEGATIVE
Comment: NORMAL
Neisseria Gonorrhea: NEGATIVE
Trichomonas: NEGATIVE

## 2021-06-03 ENCOUNTER — Ambulatory Visit
Admission: RE | Admit: 2021-06-03 | Discharge: 2021-06-03 | Disposition: A | Discharge status: Home / Self Care | Payer: Medicaid Other | Source: Ambulatory Visit

## 2021-06-03 VITALS — BP 115/76 | HR 91 | Temp 99.1°F | Resp 16

## 2021-06-03 DIAGNOSIS — Z113 Encounter for screening for infections with a predominantly sexual mode of transmission: Secondary | ICD-10-CM

## 2021-06-03 NOTE — ED Provider Notes (Signed)
?[facility] ? ? ? ?CSN: [id number] ?Arrival date & time: 06/03/21  1351 ? ? ?  ? ?History   ?Chief Complaint ?Chief Complaint  ?Patient presents with  ? Vaginal Discharge  ?  Jus wanted to get retested - Entered by patient  ? ? ?HPI ?Angela Middleton is a 21 y.o. female.  ? ?Patient presents today requesting STD testing after unprotected sexual encounter 5 days ago.  Patient was seen day of sexual encounter and received STD testing as well as a pregnancy test, all testing negative.  Denies all symptoms.  Last menstrual period 05/10/2021.  ? ?Past Medical History:  ?Diagnosis Date  ? Asthma   ? ? ?There are no problems to display for this patient. ? ? ?Past Surgical History:  ?Procedure Laterality Date  ? NO PAST SURGERIES    ? ? ?OB History   ? ? Gravida  ?2  ? Para  ?   ? Term  ?   ? Preterm  ?   ? AB  ?1  ? Living  ?   ?  ? ? SAB  ?1  ? IAB  ?   ? Ectopic  ?   ? Multiple  ?   ? Live Births  ?   ?   ?  ?  ? ? ? ?Home Medications   ? ?Prior to Admission medications   ?Medication Sig Start Date End Date Taking? Authorizing Provider  ?albuterol (VENTOLIN HFA) 108 (90 Base) MCG/ACT inhaler Inhale 1-2 puffs into the lungs every 6 (six) hours as needed for wheezing or shortness of breath. 03/19/21   Hughie Closs, PA-C  ?benzoyl peroxide-erythromycin [facility]) gel Apply 1 application topically 2 (two) times daily. Apply to face. Can also use under arms and at groin for bumps 12/05/20   Ralene Bathe, MD  ?doxycycline (MONODOX) 100 MG capsule Take 1 capsule (100 mg total) by mouth daily. With food and water. Do not lay down for 30 minutes after taking. 04/22/21   Ralene Bathe, MD  ?fluticasone [facility]) 50 MCG/ACT nasal spray Place 2 sprays into both nostrils daily. 03/19/21   Hughie Closs, PA-C  ?mupirocin ointment (BACTROBAN) 2 % Apply 1 application. topically 3 (three) times daily. 05/29/21   Laurene Footman B, PA-C  ?RETIN-A 0.025 % cream Apply topically at bedtime. 04/17/21   Ralene Bathe, MD   ?cetirizine (ZYRTEC) 10 MG tablet Take 10 mg by mouth daily.  12/14/19  [provider]  ?[place] 28 0.25-35 MG-MCG tablet Take 1 tablet by mouth daily. 10/05/19 03/06/20  [provider]  ? ? ?Family History ?Family History  ?Problem Relation Age of Onset  ? Asthma Mother   ? Healthy Mother   ? Diabetes Father   ? Healthy Father   ? Hypertension Father   ? High Cholesterol Father   ? Diabetes Paternal Grandfather   ? Hypertension Paternal Grandfather   ? Diabetes Paternal Grandmother   ? Hypertension Paternal Grandmother   ? Diabetes Maternal Uncle   ? Hypertension Maternal Uncle   ? ? ?Social History ?Social History  ? ?Tobacco Use  ? Smoking status: Never  ? Smokeless tobacco: Never  ?Vaping Use  ? Vaping Use: Some days  ? Substances: Nicotine, Flavoring  ?Substance Use Topics  ? Alcohol use: Yes  ?  Comment: socially  ? Drug use: Yes  ?  Types: Marijuana  ?  Comment: 4-5 times weekly  ? ? ? ?Allergies   ?Shrimp [employer]  allergy] ? ? ?Review of Systems ?Review of Systems  ?Genitourinary:  Positive for vaginal discharge.  ? ? ?Physical Exam ?Triage Vital Signs ?ED Triage Vitals  ?Enc Vitals Group  ?   BP 06/03/21 1420 115/76  ?   Pulse Rate 06/03/21 1420 91  ?   Resp 06/03/21 1420 16  ?   Temp 06/03/21 1420 99.1 ?F (37.3 ?C)  ?   Temp Source 06/03/21 1420 Oral  ?   SpO2 06/03/21 1420 98 %  ?   Weight --   ?   Height --   ?   Head Circumference --   ?   Peak Flow --   ?   Pain Score 06/03/21 1432 0  ?   Pain Loc --   ?   Pain Edu? --   ?   Excl. in [place]? --   ? ?No data found. ? ?Updated Vital Signs ?BP 115/76 (BP Location: Left Arm)   Pulse 91   Temp 99.1 ?F (37.3 ?C) (Oral)   Resp 16   LMP 05/10/2021   SpO2 98%  ? ?Visual Acuity ?Right Eye Distance:   ?Left Eye Distance:   ?Bilateral Distance:   ? ?Right Eye Near:   ?Left Eye Near:    ?Bilateral Near:    ? ?Physical Exam ? ? ?UC Treatments / Results  ?Labs ?(all labs ordered are listed, but only abnormal results are displayed) ?Labs Reviewed   ?CERVICOVAGINAL ANCILLARY ONLY  ? ? ?EKG ? ? ?Radiology ?No results found. ? ?Procedures ?Procedures (including critical care time) ? ?Medications Ordered in UC ?Medications - No data to display ? ?Initial Impression / Assessment and Plan / UC Course  ?I have reviewed the triage vital signs and the nursing notes. ? ?Pertinent labs & imaging results that were available during my care of the patient were reviewed by me and considered in my medical decision making (see chart for details). ? ?Screening for STI ? ?Advised patient that she is too early for accurate STI testing since last testing and unprotected encounter was 5 days ago, patient has not missed a menstrual period, will not repeat any testing today and recommended patient return in 15 days post unprotected encounter for STI testing for accuracy ?Final Clinical Impressions(s) / UC Diagnoses  ? ?Final diagnoses:  ?None  ? ?Discharge Instructions   ?None ?  ? ?ED Prescriptions   ?None ?  ? ?PDMP not reviewed this encounter. ?  ?Hans Eden, NP ?06/03/21 1446 ? ?

## 2021-06-03 NOTE — ED Triage Notes (Signed)
Pt had unprotected sex on 4/20 and was tested that same day. She wants to make sure her lab results are accurate so she wants to be retested. Pt denies any symptoms.  ?

## 2021-07-02 ENCOUNTER — Other Ambulatory Visit: Payer: Self-pay

## 2021-07-02 ENCOUNTER — Ambulatory Visit
Admission: RE | Admit: 2021-07-02 | Discharge: 2021-07-02 | Disposition: A | Discharge status: Home / Self Care | Payer: Medicaid Other | Source: Ambulatory Visit | Attending: Emergency Medicine | Admitting: Emergency Medicine

## 2021-07-02 VITALS — BP 118/80 | HR 97 | Temp 98.1°F | Resp 18

## 2021-07-02 DIAGNOSIS — J45901 Unspecified asthma with (acute) exacerbation: Secondary | ICD-10-CM | POA: Diagnosis not present

## 2021-07-02 DIAGNOSIS — Z3202 Encounter for pregnancy test, result negative: Secondary | ICD-10-CM

## 2021-07-02 DIAGNOSIS — Z113 Encounter for screening for infections with a predominantly sexual mode of transmission: Secondary | ICD-10-CM

## 2021-07-02 DIAGNOSIS — N898 Other specified noninflammatory disorders of vagina: Secondary | ICD-10-CM | POA: Insufficient documentation

## 2021-07-02 DIAGNOSIS — B001 Herpesviral vesicular dermatitis: Secondary | ICD-10-CM

## 2021-07-02 DIAGNOSIS — J069 Acute upper respiratory infection, unspecified: Secondary | ICD-10-CM | POA: Diagnosis not present

## 2021-07-02 LAB — POCT URINALYSIS DIP (MANUAL ENTRY)
Bilirubin, UA: NEGATIVE
Glucose, UA: NEGATIVE mg/dL
Leukocytes, UA: NEGATIVE
Nitrite, UA: NEGATIVE
Protein Ur, POC: NEGATIVE mg/dL
Spec Grav, UA: 1.025 (ref 1.010–1.025)
Urobilinogen, UA: 0.2 E.U./dL — AB
pH, UA: 6 (ref 5.0–8.0)

## 2021-07-02 LAB — POCT URINE PREGNANCY: Preg Test, Ur: NEGATIVE

## 2021-07-02 MED ORDER — PREDNISONE 20 MG PO TABS: 40.0000 mg | ORAL_TABLET | Freq: Every day | ORAL | 0 refills | Status: AC

## 2021-07-02 MED ORDER — VALACYCLOVIR HCL 1 G PO TABS: 2000.0000 mg | ORAL_TABLET | Freq: Two times a day (BID) | ORAL | 0 refills | Status: DC

## 2021-07-02 MED ORDER — ALBUTEROL SULFATE HFA 108 (90 BASE) MCG/ACT IN AERS: 1.0000 | INHALATION_SPRAY | RESPIRATORY_TRACT | 0 refills | Status: DC | PRN

## 2021-07-02 MED ORDER — FLUTICASONE PROPIONATE 50 MCG/ACT NA SUSP: 2.0000 | Freq: Every day | NASAL | 0 refills | Status: DC

## 2021-07-02 MED ORDER — AEROCHAMBER PLUS FLO-VU MEDIUM MISC
1.0000 | Freq: Once | Status: AC
  Administered 2021-07-02: 1

## 2021-07-02 NOTE — ED Provider Notes (Incomplete)
HPI  SUBJECTIVE:  Angela Middleton is a 21 y.o. female who presents with several issues today: First, she reports a painful vesicular "cold sore" on her right lower lip that was preceded by tingling and swelling.  It is painful with palpation only.  She denies genital rash.  She has tried tea tree oil, an unknown cold sore lip balm without improvement in her symptoms.  Symptoms are worse with palpation.  She states that she has had cold sores in the past, last episode was about 10 years ago.  Second, she would like to be checked for STDs.  She reports clear vaginal discharge no vaginal odor, bleeding, rash, itching, abdominal, back, pelvic pain, urinary complaints.  She has a new female sexual partner, who is asymptomatic.  She does not have any other partners.  No recent antibiotics.  She was tested last month for STDs, but the testing was done 24 hours after having intercourse with her partner for the first time, and she was told that the results may be negative.  Third, she reports 3 to 4 days of a cough productive of mucus, no nasal congestion, rhinorrhea, wheezing, postnasal drip, sore throat secondary to the cough.  No fevers, body aches, headaches, sinus pain or pressure, chest pain, shortness of breath.  She has been using her albuterol inhaler 4-5 times a day, she normally uses it as needed and has been taking Tessalon without improvement in her symptoms.  No aggravating factors.  She has a past medical history of HSV 1, asthma, chlamydia, UTI, and is a smoker.  No history of gonorrhea, HIV, HSV-2, syphilis, trichomonas, BV, diabetes.  LMP: 5/11 to 5/15.  It was irregular over the past 2 months, she would like to be checked for pregnancy.  PCP: [facility].    Past Medical History:  Diagnosis Date   Asthma     Past Surgical History:  Procedure Laterality Date   NO PAST SURGERIES      Family History  Problem Relation Age of Onset   Asthma Mother    Healthy Mother    Diabetes  Father    Healthy Father    Hypertension Father    High Cholesterol Father    Diabetes Paternal Grandfather    Hypertension Paternal Grandfather    Diabetes Paternal Grandmother    Hypertension Paternal Grandmother    Diabetes Maternal Uncle    Hypertension Maternal Uncle     Social History   Tobacco Use   Smoking status: Never   Smokeless tobacco: Never  Vaping Use   Vaping Use: Some days   Substances: Nicotine, Flavoring  Substance Use Topics   Alcohol use: Yes    Comment: socially   Drug use: Yes    Types: Marijuana    Comment: 4-5 times weekly    No current facility-administered medications for this encounter.  Current Outpatient Medications:    albuterol (VENTOLIN HFA) 108 (90 Base) MCG/ACT inhaler, Inhale 1-2 puffs into the lungs every 6 (six) hours as needed for wheezing or shortness of breath., Disp: 1 each, Rfl: 0   benzoyl peroxide-erythromycin (BENZAMYCIN) gel, Apply 1 application topically 2 (two) times daily. Apply to face. Can also use under arms and at groin for bumps, Disp: 23.3 g, Rfl: 6   doxycycline (MONODOX) 100 MG capsule, Take 1 capsule (100 mg total) by mouth daily. With food and water. Do not lay down for 30 minutes after taking., Disp: 30 capsule, Rfl: 0   fluticasone (FLONASE) 50 MCG/ACT  nasal spray, Place 2 sprays into both nostrils daily., Disp: 16 mL, Rfl: 0   mupirocin ointment (BACTROBAN) 2 %, Apply 1 application. topically 3 (three) times daily., Disp: 22 g, Rfl: 0   RETIN-A 0.025 % cream, Apply topically at bedtime., Disp: 45 g, Rfl: 4  Allergies  Allergen Reactions   Shrimp [Shellfish Allergy] Nausea And Vomiting     ROS  As noted in HPI.   Physical Exam  BP 118/80   Pulse 97   Temp 98.1 F (36.7 C)   Resp 18   LMP 06/19/2021 (Exact Date)   SpO2 97%   Constitutional: Well developed, well nourished, no acute distress Eyes:  EOMI, conjunctiva normal bilaterally HENT: Normocephalic, atraumatic,mucus membranes moist.  Positive  nasal congestion.  No maxillary, frontal sinus tenderness.  Normal oropharynx.  Positive postnasal drip.  Positive cold sore right lower lip  Neck: No cervical lymphadenopathy Respiratory: Normal inspiratory effort, diffuse expiratory wheezing throughout all lung fields, prolonged expiratory phase.  Positive anterior chest wall tenderness. Cardiovascular: Normal rate, regular rhythm, no murmurs rubs or gallops GI: nondistended skin: No rash, skin intact Musculoskeletal: no deformities Neurologic: Alert & oriented x 3, no focal neuro deficits Psychiatric: Speech and behavior appropriate   ED Course   Medications - No data to display  Orders Placed This Encounter  Procedures   HIV Antibody (routine testing w rflx)    Standing Status:   Standing    Number of Occurrences:   1   RPR    Standing Status:   Standing    Number of Occurrences:   1   POCT urinalysis dipstick    Standing Status:   Standing    Number of Occurrences:   1   POCT urine pregnancy    Standing Status:   Standing    Number of Occurrences:   1    Results for orders placed or performed during the hospital encounter of 07/02/21 (from the past 24 hour(s))  POCT urinalysis dipstick     Status: Abnormal   Collection Time: 07/02/21  7:28 PM  Result Value Ref Range   Color, UA yellow yellow   Clarity, UA clear clear   Glucose, UA negative negative mg/dL   Bilirubin, UA negative negative   Ketones, POC UA moderate (40) (A) negative mg/dL   Spec Grav, UA 1.025 1.010 - 1.025   Blood, UA trace-intact (A) negative   pH, UA 6.0 5.0 - 8.0   Protein Ur, POC negative negative mg/dL   Urobilinogen, UA 0.2 (A) 0.2 or 1.0 E.U./dL   Nitrite, UA Negative Negative   Leukocytes, UA Negative Negative  POCT urine pregnancy     Status: None   Collection Time: 07/02/21  7:28 PM  Result Value Ref Range   Preg Test, Ur Negative Negative   No results found.  ED Clinical Impression  1. Vaginal discharge      ED  Assessment/Plan  1.  Cold sore on lip.  Did not swab as it would not change management.  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  Home with Valtrex.  2.  Cough.  Patient with an asthma exacerbation, most likely from an upper respiratory infection.  Home with regularly scheduled albuterol inhaler with a spacer, Flonase, Mucinex, prednisone, saline nasal irrigation.  Will give patient a spacer here.  3.  STD screening.  Vaginal swab, HIV, RPR sent.  Will base treatment off of labs.  4.  Urine pregnancy negative.  Follow-up with  PCP PRN.  Discussed MDM, treatment plan, and plan for follow-up with patient. patient agrees with plan.   No orders of the defined types were placed in this encounter.     *This clinic note was created using Dragon dictation software. Therefore, there may be occasional mistakes despite careful proofreading.  ?    Melynda Ripple, MD 07/03/21 [id number]    Melynda Ripple, MD 07/03/21 [phone]

## 2021-07-02 NOTE — Discharge Instructions (Addendum)
2 Puffs from your albuterol inhaler using your spacer every 4 hours for 2 days, then every 6 hours for 2 days, then as needed.  Finish the prednisone unless a healthcare provider tells you to stop.  Mucinex, Flonase, saline nasal irrigation with a NeilMed sinus rinse and distilled water as often as you want to prevent a bacterial sinus infection.  Finish the Valtrex, even if you feel better.  This will treat the cold sore.  Continue the cold sore cream/ointment if it makes you feel better.  Your labs will be back in several days.  We will base further treatment off of these labs.  Refrain from intercourse until all your labs have been resulted, you finish treatment, and your partner has been treated if necessary.

## 2021-07-02 NOTE — ED Triage Notes (Addendum)
Patient c/o blister and productive cough x 3-4 days.   Patient endorses wheezing.   Patient endorses tingling in area.   Patient has blister present to lower lip.   Patient wants HSV testing.   Patient has used OTC cold sore lip balm and tea tree oil with no relief of symptoms.   Patient has taken Tessalon [place] with no relief of cough.   Patient c/o vaginal discharge x 3-4 days.   Patient denies foul smell.   Patient endorses milky color discharge.   Patient denies itching.   Patient wants HIV and Syphilis testing.   Patient wants testing for UTI.

## 2021-07-04 ENCOUNTER — Telehealth (HOSPITAL_COMMUNITY): Payer: Self-pay | Admitting: Emergency Medicine

## 2021-07-04 LAB — CERVICOVAGINAL ANCILLARY ONLY
Bacterial Vaginitis (gardnerella): POSITIVE — AB
Candida Glabrata: NEGATIVE
Candida Vaginitis: POSITIVE — AB
Chlamydia: NEGATIVE
Comment: NEGATIVE
Comment: NEGATIVE
Comment: NEGATIVE
Comment: NEGATIVE
Comment: NEGATIVE
Comment: NORMAL
Neisseria Gonorrhea: NEGATIVE
Trichomonas: NEGATIVE

## 2021-07-04 LAB — HIV ANTIBODY (ROUTINE TESTING W REFLEX): HIV Screen 4th Generation wRfx: NONREACTIVE

## 2021-07-04 LAB — RPR: RPR Ser Ql: NONREACTIVE

## 2021-07-04 MED ORDER — FLUCONAZOLE 150 MG PO TABS: 150.0000 mg | ORAL_TABLET | Freq: Once | ORAL | 0 refills | Status: AC

## 2021-07-04 MED ORDER — METRONIDAZOLE 500 MG PO TABS: 500.0000 mg | ORAL_TABLET | Freq: Two times a day (BID) | ORAL | 0 refills | Status: DC

## 2021-07-05 ENCOUNTER — Other Ambulatory Visit: Payer: Self-pay

## 2021-07-05 ENCOUNTER — Ambulatory Visit
Admission: EM | Admit: 2021-07-05 | Discharge: 2021-07-05 | Disposition: A | Discharge status: Home / Self Care | Payer: Medicaid Other | Attending: Emergency Medicine | Admitting: Emergency Medicine

## 2021-07-05 ENCOUNTER — Encounter: Payer: Self-pay | Admitting: Emergency Medicine

## 2021-07-05 DIAGNOSIS — R051 Acute cough: Secondary | ICD-10-CM | POA: Diagnosis not present

## 2021-07-05 MED ORDER — AZITHROMYCIN 250 MG PO TABS: ORAL_TABLET | ORAL | 0 refills | Status: DC

## 2021-07-05 NOTE — ED Provider Notes (Signed)
[facility] Provider Note  Patient Contact: 11:09 AM (approximate)   History   Cough   HPI  Angela Middleton is a 21 y.o. female presents to the emergency department with productive cough for purulent sputum production for 1 week.  Patient was seen and evaluated at [facility] urgent care on Wednesday and has been taking her prednisone and albuterol inhaler as directed.  She denies chest pain, chest tightness or shortness of breath.      Physical Exam   Triage Vital Signs: ED Triage Vitals  Enc Vitals Group     BP 07/05/21 1011 120/89     Pulse Rate 07/05/21 1011 77     Resp 07/05/21 1011 14     Temp 07/05/21 1011 98.6 F (37 C)     Temp Source 07/05/21 1011 Oral     SpO2 07/05/21 1011 96 %     Weight 07/05/21 1007 208 lb 15.9 oz (94.8 kg)     Height 07/05/21 1007 '5\' 2"'$  (1.575 m)     Head Circumference --      Peak Flow --      Pain Score 07/05/21 1007 0     Pain Loc --      Pain Edu? --      Excl. in [place]? --     Most recent vital signs: Vitals:   07/05/21 1011  BP: 120/89  Pulse: 77  Resp: 14  Temp: 98.6 F (37 C)  SpO2: 96%     General: Alert and in no acute distress. Eyes:  PERRL. EOMI. Head: No acute traumatic findings ENT:      Ears:       Nose: No congestion/rhinnorhea.      Mouth/Throat: Mucous membranes are moist. Neck: No stridor. No cervical spine tenderness to palpation. Cardiovascular:  Good peripheral perfusion Respiratory: Normal respiratory effort without tachypnea or retractions. Lungs CTAB. Good air entry to the bases with no decreased or absent breath sounds. Gastrointestinal: Bowel sounds 4 quadrants. Soft and nontender to palpation. No guarding or rigidity. No palpable masses. No distention. No CVA tenderness. Musculoskeletal: Full range of motion to all extremities.  Neurologic:  No gross focal neurologic deficits are appreciated.  Skin:   No rash noted Other:   ED Results / Procedures / Treatments    Labs (all labs ordered are listed, but only abnormal results are displayed) Labs Reviewed - No data to display      PROCEDURES:  Critical Care performed: No  Procedures   MEDICATIONS ORDERED IN ED: Medications - No data to display   IMPRESSION / MDM / [place] / ED COURSE  I reviewed the triage vital signs and the nursing notes.                              Assessment and plan Cough 21 year old female presents to the urgent care with cough productive for purulent sputum production for 1 week.  We will treat patient with azithromycin to cover her for early post viral pneumonia.  Recommended continuing prednisone as directed.  Return precautions were given to return with new or worsening symptoms.     FINAL CLINICAL IMPRESSION(S) / ED DIAGNOSES   Final diagnoses:  Acute cough     Rx / DC Orders   ED Discharge Orders          Ordered    azithromycin (ZITHROMAX Z-PAK) 250 MG tablet  07/05/21 1048             Note:  This document was prepared using Dragon voice recognition software and may include unintentional dictation errors.   Vallarie Mare [place], [place] 07/05/21 1111

## 2021-07-05 NOTE — Discharge Instructions (Signed)
Take azithromycin as directed.

## 2021-07-05 NOTE — ED Triage Notes (Signed)
Patient c/o cough and chest congestion for a week.  Patient states that she was seen at [place] on Wed.  Patient states that her cough has not gotten.  Patient denies fevers.

## 2021-07-06 ENCOUNTER — Ambulatory Visit: Payer: Self-pay

## 2021-07-15 ENCOUNTER — Ambulatory Visit
Admission: RE | Admit: 2021-07-15 | Discharge: 2021-07-15 | Discharge status: Against Medical Advice | Payer: Medicaid Other | Source: Ambulatory Visit

## 2021-07-29 ENCOUNTER — Ambulatory Visit (INDEPENDENT_AMBULATORY_CARE_PROVIDER_SITE_OTHER): Payer: Medicaid Other

## 2021-07-29 ENCOUNTER — Ambulatory Visit
Admission: RE | Admit: 2021-07-29 | Discharge: 2021-07-29 | Disposition: A | Discharge status: Home / Self Care | Payer: Medicaid Other | Source: Ambulatory Visit | Attending: Physician Assistant | Admitting: Physician Assistant

## 2021-07-29 VITALS — BP 104/66 | HR 88 | Temp 98.1°F | Resp 16

## 2021-07-29 DIAGNOSIS — J45901 Unspecified asthma with (acute) exacerbation: Secondary | ICD-10-CM

## 2021-07-29 DIAGNOSIS — R059 Cough, unspecified: Secondary | ICD-10-CM | POA: Diagnosis not present

## 2021-07-29 DIAGNOSIS — R0602 Shortness of breath: Secondary | ICD-10-CM

## 2021-07-29 DIAGNOSIS — R051 Acute cough: Secondary | ICD-10-CM

## 2021-07-29 MED ORDER — FLUTICASONE-SALMETEROL 115-21 MCG/ACT IN AERO
2.0000 | INHALATION_SPRAY | Freq: Two times a day (BID) | RESPIRATORY_TRACT | 1 refills | Status: DC
Start: 2021-07-29 — End: 2022-12-06

## 2021-07-29 MED ORDER — BENZONATATE 200 MG PO CAPS: 200.0000 mg | ORAL_CAPSULE | Freq: Three times a day (TID) | ORAL | 0 refills | Status: DC | PRN

## 2021-07-29 MED ORDER — ALBUTEROL SULFATE HFA 108 (90 BASE) MCG/ACT IN AERS
1.0000 | INHALATION_SPRAY | Freq: Four times a day (QID) | RESPIRATORY_TRACT | 1 refills | Status: DC | PRN
Start: 2021-07-29 — End: 2022-03-19

## 2021-07-29 MED ORDER — PROMETHAZINE-DM 6.25-15 MG/5ML PO SYRP: 5.0000 mL | ORAL_SOLUTION | Freq: Every evening | ORAL | 0 refills | Status: DC | PRN

## 2021-07-29 NOTE — Discharge Instructions (Addendum)
-  Your x-ray is normal.  No evidence of pneumonia.  You likely have continued asthma flareup related to the viral bronchitis - Continue albuterol as needed.  We will also try a new inhaler called Advair.  Make sure that you brush your teeth and rinse your mouth out after using this or you can develop thrush which is a fungal infection in your mouth. - I have sent cough medicine for you as well and continue your antihistamines. - Keep your appointment with your doctor in a couple weeks. - Return here sooner for any worsening of symptoms.

## 2021-07-29 NOTE — ED Provider Notes (Signed)
[facility]    CSN: [id number] Arrival date & time: 07/29/21  1752      History   Chief Complaint Chief Complaint  Patient presents with   Cough   Shortness of Breath    HPI Angela Middleton is a 21 y.o. female with history of mild well-controlled asthma.  Patient presents today for 1 month history of cough and shortness of breath.  Cough is occasionally productive.  States at onset it was productive of yellowish-green sputum which then became clear and is now becoming discolored again.  No associated fevers.  She reports increased shortness of breath and worsening cough as well as chest tightness at nighttime and when she first wakes up.  Patient reports that she has taken prednisone and use albuterol.  Also took azithromycin the first week of symptoms.  She says none of the medications have helped and she does not feel any better.  She does vape.  Patient has an appoint with her primary care provider in another 2 weeks to discuss her symptoms but thought she should be seen sooner than that.  She has kept her appointment.  She has no other concerns.  HPI  Past Medical History:  Diagnosis Date   Asthma     There are no problems to display for this patient.   Past Surgical History:  Procedure Laterality Date   NO PAST SURGERIES      OB History     Gravida  2   Para      Term      Preterm      AB  1   Living         SAB  1   IAB      Ectopic      Multiple      Live Births               Home Medications    Prior to Admission medications   Medication Sig Start Date End Date Taking? Authorizing Provider  albuterol (VENTOLIN HFA) 108 (90 Base) MCG/ACT inhaler Inhale 1-2 puffs into the lungs every 6 (six) hours as needed for wheezing or shortness of breath. 07/29/21  Yes Laurene Footman B, PA-C  benzonatate (TESSALON) 200 MG capsule Take 1 capsule (200 mg total) by mouth 3 (three) times daily as needed for cough. 07/29/21  Yes Laurene Footman B,  PA-C  fluticasone-salmeterol (ADVAIR HFA) [phone] MCG/ACT inhaler Inhale 2 puffs into the lungs 2 (two) times daily. 07/29/21  Yes Danton Clap, PA-C  promethazine-dextromethorphan (PROMETHAZINE-DM) 6.25-15 MG/5ML syrup Take 5 mLs by mouth at bedtime as needed for cough. 07/29/21  Yes Danton Clap, PA-C  albuterol (VENTOLIN HFA) 108 (90 Base) MCG/ACT inhaler Inhale 1-2 puffs into the lungs every 4 (four) hours as needed for wheezing or shortness of breath. 07/02/21   Melynda Ripple, MD  azithromycin (ZITHROMAX Z-PAK) 250 MG tablet Take 2 tablets on the first day.  Take 1 tablet on days 2 through 5. 07/05/21   Lannie Fields, PA-C  fluticasone [facility]) 50 MCG/ACT nasal spray Place 2 sprays into both nostrils daily. 07/02/21   Melynda Ripple, MD  metroNIDAZOLE (FLAGYL) 500 MG tablet Take 1 tablet (500 mg total) by mouth 2 (two) times daily. 07/04/21   Lamptey, Myrene Galas, MD  mupirocin ointment (BACTROBAN) 2 % Apply 1 application. topically 3 (three) times daily. 05/29/21   Laurene Footman B, PA-C  RETIN-A 0.025 % cream Apply topically at bedtime. 04/17/21  Ralene Bathe, MD  valACYclovir (VALTREX) 1000 MG tablet Take 2 tablets (2,000 mg total) by mouth every 12 (twelve) hours. X 1 day 07/02/21   Melynda Ripple, MD  cetirizine (ZYRTEC) 10 MG tablet Take 10 mg by mouth daily.  12/14/19  [provider]  Mignon 28 0.25-35 MG-MCG tablet Take 1 tablet by mouth daily. 10/05/19 03/06/20  [provider]    Family History Family History  Problem Relation Age of Onset   Asthma Mother    Healthy Mother    Diabetes Father    Healthy Father    Hypertension Father    High Cholesterol Father    Diabetes Paternal Grandfather    Hypertension Paternal Grandfather    Diabetes Paternal Grandmother    Hypertension Paternal Grandmother    Diabetes Maternal Uncle    Hypertension Maternal Uncle     Social History Social History   Tobacco Use   Smoking status: Never   Smokeless  tobacco: Never  Vaping Use   Vaping Use: Some days   Substances: Nicotine, Flavoring  Substance Use Topics   Alcohol use: Yes    Comment: socially   Drug use: Yes    Types: Marijuana    Comment: 4-5 times weekly     Allergies   Shrimp [shellfish allergy]   Review of Systems Review of Systems  Constitutional:  Negative for chills, diaphoresis, fatigue and fever.  HENT:  Positive for congestion. Negative for ear pain, rhinorrhea, sinus pressure, sinus pain and sore throat.   Respiratory:  Positive for cough and shortness of breath.   Cardiovascular:  Positive for chest pain.  Gastrointestinal:  Negative for abdominal pain, nausea and vomiting.  Musculoskeletal:  Negative for arthralgias and myalgias.  Skin:  Negative for rash.  Neurological:  Negative for weakness and headaches.  Hematological:  Negative for adenopathy.     Physical Exam Triage Vital Signs ED Triage Vitals  Enc Vitals Group     BP 07/29/21 1828 104/66     Pulse Rate 07/29/21 1828 88     Resp 07/29/21 1828 16     Temp 07/29/21 1828 98.1 F (36.7 C)     Temp Source 07/29/21 1828 Oral     SpO2 07/29/21 1828 100 %     Weight --      Height --      Head Circumference --      Peak Flow --      Pain Score 07/29/21 1826 5     Pain Loc --      Pain Edu? --      Excl. in [place]? --    No data found.  Updated Vital Signs BP 104/66 (BP Location: Right Arm)   Pulse 88   Temp 98.1 F (36.7 C) (Oral)   Resp 16   LMP 07/15/2021 (Approximate)   SpO2 100%      Physical Exam Vitals and nursing note reviewed.  Constitutional:      General: She is not in acute distress.    Appearance: Normal appearance. She is not ill-appearing or toxic-appearing.  HENT:     Head: Normocephalic and atraumatic.     Nose: Nose normal.     Mouth/Throat:     Mouth: Mucous membranes are moist.     Pharynx: Oropharynx is clear.  Eyes:     General: No scleral icterus.       Right eye: No discharge.        Left eye: No  discharge.  Conjunctiva/sclera: Conjunctivae normal.  Cardiovascular:     Rate and Rhythm: Normal rate and regular rhythm.     Heart sounds: Normal heart sounds.  Pulmonary:     Effort: Pulmonary effort is normal. No respiratory distress.     Breath sounds: Normal breath sounds. No wheezing, rhonchi or rales.  Musculoskeletal:     Cervical back: Neck supple.  Skin:    General: Skin is dry.  Neurological:     General: No focal deficit present.     Mental Status: She is alert. Mental status is at baseline.     Motor: No weakness.     Gait: Gait normal.  Psychiatric:        Mood and Affect: Mood normal.        Behavior: Behavior normal.        Thought Content: Thought content normal.      UC Treatments / Results  Labs (all labs ordered are listed, but only abnormal results are displayed) Labs Reviewed - No data to display  EKG   Radiology DG Chest 2 View  Result Date: 07/29/2021 CLINICAL DATA:  Productive cough and shortness of breath. EXAM: CHEST - 2 VIEW COMPARISON:  March 26, 2020 FINDINGS: The heart size and mediastinal contours are within normal limits. Both lungs are clear. The visualized skeletal structures are unremarkable. IMPRESSION: No active cardiopulmonary disease. Electronically Signed   By: Virgina Norfolk M.D.   On: 07/29/2021 19:00    Procedures Procedures (including critical care time)  Medications Ordered in UC Medications - No data to display  Initial Impression / Assessment and Plan / UC Course  I have reviewed the triage vital signs and the nursing notes.  Pertinent labs & imaging results that were available during my care of the patient were reviewed by me and considered in my medical decision making (see chart for details).  21 year old female with history of mild well-controlled asthma presenting for 1 month history of cough, congestion, chest pain and shortness of breath.  Patient has already been treated with prednisone and azithromycin  as well as albuterol.  Uses albuterol 5-7 times a day for shortness of breath.  Takes antihistamines for allergies.  Vitals normal and stable patient overall well-appearing.  In no acute distress.  Chest clear to auscultation heart regular rate rhythm.  Chest x-ray performed and normal.  Discussed the result with patient.  Advised patient symptoms consistent with viral bronchitis and likely continued asthma exacerbation.  She states that the prednisone did not help.  Would like to try different inhaler so I sent Advair.  Also refilled albuterol and sent benzonatate and Promethazine DM.  Encouraged increasing rest and fluids.  Advised to keep appointment with her primary care provider.  Advise following up here sooner if symptoms are worsening or for any other concerns.   Final Clinical Impressions(s) / UC Diagnoses   Final diagnoses:  Asthma with acute exacerbation, unspecified asthma severity, unspecified whether persistent  Acute cough  Shortness of breath     Discharge Instructions      -Your x-ray is normal.  No evidence of pneumonia.  You likely have continued asthma flareup related to the viral bronchitis - Continue albuterol as needed.  We will also try a new inhaler called Advair.  Make sure that you brush your teeth and rinse your mouth out after using this or you can develop thrush which is a fungal infection in your mouth. - I have sent cough medicine for you as well and  continue your antihistamines. - Keep your appointment with your doctor in a couple weeks. - Return here sooner for any worsening of symptoms.   ED Prescriptions     Medication Sig Dispense Auth. Provider   albuterol (VENTOLIN HFA) 108 (90 Base) MCG/ACT inhaler Inhale 1-2 puffs into the lungs every 6 (six) hours as needed for wheezing or shortness of breath. 1 g Laurene Footman B, PA-C   benzonatate (TESSALON) 200 MG capsule Take 1 capsule (200 mg total) by mouth 3 (three) times daily as needed for cough. 30  capsule Danton Clap, PA-C   promethazine-dextromethorphan (PROMETHAZINE-DM) 6.25-15 MG/5ML syrup Take 5 mLs by mouth at bedtime as needed for cough. 118 mL Laurene Footman B, PA-C   fluticasone-salmeterol (ADVAIR [facility]) 115-21 MCG/ACT inhaler Inhale 2 puffs into the lungs 2 (two) times daily. 1 each Gretta Cool      PDMP not reviewed this encounter.   Danton Clap, PA-C 07/29/21 1941

## 2021-07-29 NOTE — ED Triage Notes (Signed)
Patient presents to Urgent Care with complaints of productive cough and SOB x 1 month. Treating symptoms with inhaler. Has a hx of asthma.

## 2021-07-31 ENCOUNTER — Ambulatory Visit: Payer: Medicaid Other | Admitting: Dermatology

## 2021-07-31 DIAGNOSIS — D492 Neoplasm of unspecified behavior of bone, soft tissue, and skin: Secondary | ICD-10-CM

## 2021-07-31 DIAGNOSIS — L732 Hidradenitis suppurativa: Secondary | ICD-10-CM

## 2021-07-31 MED ORDER — DOXYCYCLINE HYCLATE 100 MG PO TABS: 100.0000 mg | ORAL_TABLET | Freq: Every day | ORAL | 3 refills | Status: DC

## 2021-07-31 NOTE — Progress Notes (Signed)
   Follow-Up Visit   Subjective  Angela Middleton is a 21 y.o. female who presents for the following: Follow-up (3 months on Hidradenitis suppurativa in the groin area and axillae, pt took Doxycycline 100 mg twice a day for 1 week with a poor response. ). Pt has a flare with boils with multiple lesions with each flare The patient has spots, moles and lesions to be evaluated, some may be new or changing and the patient has concerns that these could be cancer.  The following portions of the chart were reviewed this encounter and updated as appropriate:   Tobacco  Allergies  Meds  Problems  Med Hx  Surg Hx  Fam Hx     Review of Systems:  No other skin or systemic complaints except as noted in HPI or Assessment and Plan.  Objective  Well appearing patient in no apparent distress; mood and affect are within normal limits.  A focused examination was performed including axillae, groin. Relevant physical exam findings are noted in the Assessment and Plan.  groin, axillae Scarring at left and right axillary, tender red papule at right axilla and left axilla, tender red papule at right inframammary.   right lower lip 0.5 cm flesh papule     Assessment & Plan  Hidradenitis suppurativa groin, axillae  Chronic and persistent condition with duration or expected duration over one year. Condition is symptomatic / bothersome to patient. Not to goal.   Hidradenitis Suppurativa is a chronic; persistent; non-curable, but treatable condition due to abnormal inflamed sweat glands in the body folds (axilla, inframammary, groin, medial thighs), causing recurrent painful draining cysts and scarring. It can be associated with severe scarring acne and cysts; also abscesses and scarring of scalp. The goal is control and prevention of flares, as it is not curable. Scars are permanent and can be thickened. Treatment may include daily use of topical medication and oral antibiotics.  Oral isotretinoin may  also be helpful.  For more severe cases, Humira (a biologic injection) may be prescribed to decrease the inflammatory process and prevent flares.  When indicated, inflamed cysts may also be treated surgically.   Start Doxycycline 100 mg take 1 tablet once a day with food #90 2RF  Doxycycline should be taken with food to prevent nausea. Do not lay down for 30 minutes after taking. Be cautious with sun exposure and use good sun protection while on this medication. Pregnant women should not take this medication.    Related Medications doxycycline (VIBRA-TABS) 100 MG tablet Take 1 tablet (100 mg total) by mouth daily.  Neoplasm of skin -consistent with benign nevus. right lower lip Benign-appearing.  Observation.  Call clinic for new or changing lesions.  Recommend daily use of broad spectrum spf 30+ sunscreen to sun-exposed areas.  Pt may be interested in removing due to trauma and irritation.  Discussed shave removal. The patient will observe these symptoms, and report promptly any worsening or unexpected persistence.  If well, may return prn.   Return in about 3 months (around 10/31/2021) for HS .  IMarye Round, CMA, am acting as scribe for Sarina Ser, MD .  Documentation: I have reviewed the above documentation for accuracy and completeness, and I agree with the above.  Sarina Ser, MD

## 2021-08-07 ENCOUNTER — Ambulatory Visit
Admission: RE | Admit: 2021-08-07 | Discharge: 2021-08-07 | Disposition: A | Discharge status: Home / Self Care | Payer: Medicaid Other | Source: Ambulatory Visit | Attending: Family Medicine | Admitting: Family Medicine

## 2021-08-07 VITALS — BP 98/63 | HR 83 | Temp 99.2°F | Resp 16

## 2021-08-07 DIAGNOSIS — R1031 Right lower quadrant pain: Secondary | ICD-10-CM | POA: Insufficient documentation

## 2021-08-07 DIAGNOSIS — R1032 Left lower quadrant pain: Secondary | ICD-10-CM | POA: Insufficient documentation

## 2021-08-07 DIAGNOSIS — N898 Other specified noninflammatory disorders of vagina: Secondary | ICD-10-CM | POA: Insufficient documentation

## 2021-08-07 LAB — POCT URINALYSIS DIP (MANUAL ENTRY)
Bilirubin, UA: NEGATIVE
Blood, UA: NEGATIVE
Glucose, UA: NEGATIVE mg/dL
Ketones, POC UA: NEGATIVE mg/dL
Leukocytes, UA: NEGATIVE
Nitrite, UA: NEGATIVE
Protein Ur, POC: NEGATIVE mg/dL
Spec Grav, UA: 1.025 (ref 1.010–1.025)
Urobilinogen, UA: 0.2 E.U./dL
pH, UA: 6 (ref 5.0–8.0)

## 2021-08-07 LAB — POCT URINE PREGNANCY: Preg Test, Ur: NEGATIVE

## 2021-08-07 NOTE — Discharge Instructions (Signed)
If your abdominal symptoms worsen, go immediately the emergency department. Your symptoms could be related to premenstrual cramping however was reassuring as at the symptoms resolve with activity.  I would recommend retesting for pregnancy if you have not had a.  By July 10 as is rather early to check for a positive pregnancy since ended approximately 2 weeks ago. We will notify you in 3 to 4 days of your vaginal cytology results.  If any treatment is warranted we will notify you by phone or via [place].

## 2021-08-07 NOTE — ED Provider Notes (Signed)
[name]    CSN: [id number] Arrival date & time: 08/07/21  1804      History   Chief Complaint Chief Complaint  Patient presents with   Abdominal Pain    Been having stomach pain for last 2 days thrown up n still don't feel better - Entered by patient   Nausea   Emesis    HPI Angela Middleton is a 21 y.o. female.   HPI Patient presents today for evaluation of atypical lower abdominal pain which she describes as cramping only upon awakening in the morning for the last 2 days.  She had nausea for the last 2 days only in the morning with 1 episode of clear emesis.  Her last menstrual period ended June 10. She reports the cycle has been normal.  She thought she may be pregnant and took a home pregnancy test.  Pregnancy test here in clinic was negative.  She subsequently thought she may have a UTI or vaginitis.  She endorses abnormal vaginal discharge and no dysuria. Past Medical History:  Diagnosis Date   Asthma     There are no problems to display for this patient.   Past Surgical History:  Procedure Laterality Date   NO PAST SURGERIES      OB History     Gravida  2   Para      Term      Preterm      AB  1   Living         SAB  1   IAB      Ectopic      Multiple      Live Births               Home Medications    Prior to Admission medications   Medication Sig Start Date End Date Taking? Authorizing Provider  albuterol (VENTOLIN HFA) 108 (90 Base) MCG/ACT inhaler Inhale 1-2 puffs into the lungs every 4 (four) hours as needed for wheezing or shortness of breath. 07/02/21   Melynda Ripple, MD  albuterol (VENTOLIN HFA) 108 (90 Base) MCG/ACT inhaler Inhale 1-2 puffs into the lungs every 6 (six) hours as needed for wheezing or shortness of breath. 07/29/21   Laurene Footman B, PA-C  azithromycin (ZITHROMAX Z-PAK) 250 MG tablet Take 2 tablets on the first day.  Take 1 tablet on days 2 through 5. 07/05/21   Lannie Fields, PA-C   benzonatate (TESSALON) 200 MG capsule Take 1 capsule (200 mg total) by mouth 3 (three) times daily as needed for cough. 07/29/21   Danton Clap, PA-C  doxycycline (VIBRA-TABS) 100 MG tablet Take 1 tablet (100 mg total) by mouth daily. 07/31/21 08/30/21  Ralene Bathe, MD  fluticasone (FLONASE) 50 MCG/ACT nasal spray Place 2 sprays into both nostrils daily. 07/02/21   Melynda Ripple, MD  fluticasone-salmeterol (ADVAIR HFA) [phone] MCG/ACT inhaler Inhale 2 puffs into the lungs 2 (two) times daily. 07/29/21   Danton Clap, PA-C  metroNIDAZOLE (FLAGYL) 500 MG tablet Take 1 tablet (500 mg total) by mouth 2 (two) times daily. 07/04/21   Lamptey, Myrene Galas, MD  mupirocin ointment (BACTROBAN) 2 % Apply 1 application. topically 3 (three) times daily. 05/29/21   Danton Clap, PA-C  promethazine-dextromethorphan (PROMETHAZINE-DM) 6.25-15 MG/5ML syrup Take 5 mLs by mouth at bedtime as needed for cough. 07/29/21   Laurene Footman B, PA-C  RETIN-A 0.025 % cream Apply topically at bedtime. 04/17/21   Ralene Bathe, MD  valACYclovir (VALTREX) 1000 MG tablet Take 2 tablets (2,000 mg total) by mouth every 12 (twelve) hours. X 1 day 07/02/21   Melynda Ripple, MD  cetirizine (ZYRTEC) 10 MG tablet Take 10 mg by mouth daily.  12/14/19  [provider]  [place] 28 0.25-35 MG-MCG tablet Take 1 tablet by mouth daily. 10/05/19 03/06/20  [provider]    Family History Family History  Problem Relation Age of Onset   Asthma Mother    Healthy Mother    Diabetes Father    Healthy Father    Hypertension Father    High Cholesterol Father    Diabetes Paternal Grandfather    Hypertension Paternal Grandfather    Diabetes Paternal Grandmother    Hypertension Paternal Grandmother    Diabetes Maternal Uncle    Hypertension Maternal Uncle     Social History Social History   Tobacco Use   Smoking status: Never   Smokeless tobacco: Never  Vaping Use   Vaping Use: Some days   Substances:  Nicotine, Flavoring  Substance Use Topics   Alcohol use: Yes    Comment: socially   Drug use: Yes    Types: Marijuana    Comment: 4-5 times weekly     Allergies   Shrimp [shellfish allergy]   Review of Systems Review of Systems Pertinent negatives listed in HPI  Physical Exam Triage Vital Signs ED Triage Vitals  Enc Vitals Group     BP 08/07/21 1847 98/63     Pulse Rate 08/07/21 1847 83     Resp 08/07/21 1847 16     Temp 08/07/21 1847 99.2 F (37.3 C)     Temp Source 08/07/21 1847 Oral     SpO2 08/07/21 1847 98 %     Weight --      Height --      Head Circumference --      Peak Flow --      Pain Score 08/07/21 1846 4     Pain Loc --      Pain Edu? --      Excl. in [place]? --    No data found.  Updated Vital Signs BP 98/63 (BP Location: Left Arm)   Pulse 83   Temp 99.2 F (37.3 C) (Oral)   Resp 16   LMP 07/17/2021 (Approximate)   SpO2 98%   Visual Acuity Right Eye Distance:   Left Eye Distance:   Bilateral Distance:    Right Eye Near:   Left Eye Near:    Bilateral Near:     Physical Exam General appearance: Alert, well developed, well nourished, cooperative and in no distress Head: Normocephalic, without obvious abnormality, atraumatic Respiratory: Respirations even and unlabored, normal respiratory rate Heart: rate and rhythm normal.   Abdominal: Bowel sounds active, no abdominal tenderness elicited on exam, no CVA tenderness.  Extremities: No gross deformities Skin: Skin color, texture, turgor normal. No rashes seen  Psych: Appropriate mood and affect.   UC Treatments / Results  Labs (all labs ordered are listed, but only abnormal results are displayed) Labs Reviewed  POCT URINE PREGNANCY  POCT URINALYSIS DIP (MANUAL ENTRY)  CERVICOVAGINAL ANCILLARY ONLY    EKG   Radiology No results found.  Procedures Procedures (including critical care time)  Medications Ordered in UC Medications - No data to display  Initial Impression /  Assessment and Plan / UC Course  I have reviewed the triage vital signs and the nursing notes.  Pertinent labs & imaging results that  were available during my care of the patient were reviewed by me and considered in my medical decision making (see chart for details).    Vaginal Discharge, vaginal cytology pending.  UA unremarkable.  Urine pregnancy negative. Bilateral Lower Abdominal Cramping , reassuring that symptoms resolved with activity.  Suspect she may be having idiopathic premenstrual cramping.  Recommend watch and wait.  Advised of red flag symptoms which warrant emergent evaluation.  Patient discharged stable. Final Clinical Impressions(s) / UC Diagnoses   Final diagnoses:  Vaginal discharge  Bilateral lower abdominal cramping     Discharge Instructions      If your abdominal symptoms worsen, go immediately the emergency department. Your symptoms could be related to premenstrual cramping however was reassuring as at the symptoms resolve with activity.  I would recommend retesting for pregnancy if you have not had a.  By July 10 as is rather early to check for a positive pregnancy since ended approximately 2 weeks ago. We will notify you in 3 to 4 days of your vaginal cytology results.  If any treatment is warranted we will notify you by phone or via [place].    ED Prescriptions   None    PDMP not reviewed this encounter.   Scot Jun, FNP 08/07/21 [phone]

## 2021-08-07 NOTE — ED Triage Notes (Signed)
Pt presents with lower abdominal pain across her abdomen x 2 days. She also has had nausea and vomiting the past 2 mornings.

## 2021-08-11 ENCOUNTER — Telehealth (HOSPITAL_COMMUNITY): Payer: Self-pay | Admitting: Emergency Medicine

## 2021-08-11 LAB — CERVICOVAGINAL ANCILLARY ONLY
Bacterial Vaginitis (gardnerella): POSITIVE — AB
Candida Glabrata: NEGATIVE
Candida Vaginitis: NEGATIVE
Chlamydia: NEGATIVE
Comment: NEGATIVE
Comment: NEGATIVE
Comment: NEGATIVE
Comment: NEGATIVE
Comment: NEGATIVE
Comment: NORMAL
Neisseria Gonorrhea: NEGATIVE
Trichomonas: NEGATIVE

## 2021-08-11 MED ORDER — METRONIDAZOLE 500 MG PO TABS: 500.0000 mg | ORAL_TABLET | Freq: Two times a day (BID) | ORAL | 0 refills | Status: DC

## 2021-08-18 ENCOUNTER — Ambulatory Visit
Admission: RE | Admit: 2021-08-18 | Discharge: 2021-08-18 | Discharge status: Against Medical Advice | Payer: Medicaid Other | Source: Ambulatory Visit

## 2021-08-19 ENCOUNTER — Encounter: Payer: Self-pay | Admitting: Dermatology

## 2021-09-10 ENCOUNTER — Telehealth: Payer: Self-pay

## 2021-09-10 MED ORDER — MINOCYCLINE HCL 50 MG PO CAPS: 50.0000 mg | ORAL_CAPSULE | Freq: Every day | ORAL | 2 refills | Status: DC

## 2021-09-10 NOTE — Telephone Encounter (Signed)
Patient advised of information per Dr. Kowalski and RX sent in. aw 

## 2021-09-10 NOTE — Telephone Encounter (Signed)
Patient called regarding doxycycline '100mg'$  she is currently taking 1 PO QD for HS. She states she takes this in the PM with a full meal and still having nausea & vomiting. Patient asking for alternative.

## 2021-11-03 ENCOUNTER — Ambulatory Visit: Payer: Medicaid Other | Admitting: Dermatology

## 2021-11-04 ENCOUNTER — Ambulatory Visit: Payer: Medicaid Other | Admitting: Dermatology

## 2021-11-04 DIAGNOSIS — L732 Hidradenitis suppurativa: Secondary | ICD-10-CM | POA: Diagnosis not present

## 2021-11-04 DIAGNOSIS — Z79899 Other long term (current) drug therapy: Secondary | ICD-10-CM | POA: Diagnosis not present

## 2021-11-04 DIAGNOSIS — L700 Acne vulgaris: Secondary | ICD-10-CM | POA: Diagnosis not present

## 2021-11-04 DIAGNOSIS — Z7189 Other specified counseling: Secondary | ICD-10-CM

## 2021-11-04 MED ORDER — MINOCYCLINE HCL 50 MG PO CAPS: 50.0000 mg | ORAL_CAPSULE | Freq: Every day | ORAL | 2 refills | Status: DC

## 2021-11-04 MED ORDER — MUPIROCIN 2 % EX OINT: TOPICAL_OINTMENT | CUTANEOUS | 0 refills | Status: DC

## 2021-11-04 NOTE — Progress Notes (Unsigned)
   Follow-Up Visit   Subjective  Angela Middleton is a 21 y.o. female who presents for the following: HS (Doxycycline gave pt nausea even if she took it with food. Minocycline '50mg'$  po QD was sent in but patient hasn't filled it. Currently she is using OTC medications BP wash, Honey gel, and smile's PRID drawing out ointment but continues to flare).  The following portions of the chart were reviewed this encounter and updated as appropriate:   Tobacco  Allergies  Meds  Problems  Med Hx  Surg Hx  Fam Hx     Review of Systems:  No other skin or systemic complaints except as noted in HPI or Assessment and Plan.  Objective  Well appearing patient in no apparent distress; mood and affect are within normal limits.  A focused examination was performed including the face, groin, and axilla. Relevant physical exam findings are noted in the Assessment and Plan.  Axilla, groin, buttocks, genital Scarring and pink macules of the axilla. PIPA and scarring of the inner thighs.   Neck - Anterior Papule on the neck.    Assessment & Plan  Hidradenitis suppurativa Axilla, groin, buttocks, genital  Doxycycline caused nausea - pt unable to tolerate.   Hidradenitis Suppurativa is a chronic; persistent; non-curable, but treatable condition due to abnormal inflamed sweat glands in the body folds (axilla, inframammary, groin, medial thighs), causing recurrent painful draining cysts and scarring. It can be associated with severe scarring acne and cysts; also abscesses and scarring of scalp. The goal is control and prevention of flares, as it is not curable. Scars are permanent and can be thickened. Treatment may include daily use of topical medication and oral antibiotics.  Oral isotretinoin may also be helpful.  For more severe cases, Humira (a biologic injection) may be prescribed to decrease the inflammatory process and prevent flares.  When indicated, inflamed cysts may also be treated surgically.    Discussed Isotretinoin and potential side effects. Reviewed potential side effects of isotretinoin including xerosis, cheilitis, hepatitis, hyperlipidemia, and severe birth defects if taken by a pregnant woman. Reviewed reports of suicidal ideation in those with a history of depression while taking isotretinoin and reports of diagnosis of inflammatory bowl disease while taking isotretinoin as well as the lack of evidence for a causal relationship between isotretinoin, depression and IBD. Patient advised to reach out with any questions or concerns. Patient advised not to share pills or donate blood while on treatment or for one month after completing treatment.  Pt defers Isotretinoin at this time.  Start Minocycline '50mg'$  po QD.   Consider Seysara. Samples given today.   Ok to continue [place] honey gel, Smile's PRID drawing out ointment, and BP wash.  Patient requested prescription for Mupirocin 2% ointment. Sent to pharmacy.   mupirocin ointment (BACTROBAN) 2 % - Axilla, groin, buttocks, genital Apply to wound QD until healed. Related Medications minocycline (MINOCIN) 50 MG capsule Take 1 capsule (50 mg total) by mouth daily. Take in the PM with full meal and plenty of water  Acne vulgaris Neck - Anterior Minocycline as above  Return in about 2 months (around 01/04/2022) for hidradenitis suppurativa follow up .  Luther Redo, CMA, am acting as scribe for Sarina Ser, MD . Documentation: I have reviewed the above documentation for accuracy and completeness, and I agree with the above.  Sarina Ser, MD

## 2021-11-04 NOTE — Patient Instructions (Signed)
Due to recent changes in healthcare laws, you may see results of your pathology and/or laboratory studies on MyChart before the doctors have had a chance to review them. We understand that in some cases there may be results that are confusing or concerning to you. Please understand that not all results are received at the same time and often the doctors may need to interpret multiple results in order to provide you with the best plan of care or course of treatment. Therefore, we ask that you please give us 2 business days to thoroughly review all your results before contacting the office for clarification. Should we see a critical lab result, you will be contacted sooner.   If You Need Anything After Your Visit  If you have any questions or concerns for your doctor, please call our main line at [phone] and press option 4 to reach your doctor's medical assistant. If no one answers, please leave a voicemail as directed and we will return your call as soon as possible. Messages left after 4 pm will be answered the following business day.   You may also send us a message via MyChart. We typically respond to MyChart messages within 1-2 business days.  For prescription refills, please ask your pharmacy to contact our office. Our fax number is [phone].  If you have an urgent issue when the clinic is closed that cannot wait until the next business day, you can page your doctor at the number below.    Please note that while we do our best to be available for urgent issues outside of office hours, we are not available 24/7.   If you have an urgent issue and are unable to reach us, you may choose to seek medical care at your doctor's office, retail clinic, urgent care center, or emergency room.  If you have a medical emergency, please immediately call 911 or go to the emergency department.  Pager Numbers  - Dr. Kowalski: [phone]  - Dr. Moye: [phone]  - Dr. Stewart:  [phone]  In the event of inclement weather, please call our main line at [phone] for an update on the status of any delays or closures.  Dermatology Medication Tips: Please keep the boxes that topical medications come in in order to help keep track of the instructions about where and how to use these. Pharmacies typically print the medication instructions only on the boxes and not directly on the medication tubes.   If your medication is too expensive, please contact our office at [phone] option 4 or send us a message through MyChart.   We are unable to tell what your co-pay for medications will be in advance as this is different depending on your insurance coverage. However, we may be able to find a substitute medication at lower cost or fill out paperwork to get insurance to cover a needed medication.   If a prior authorization is required to get your medication covered by your insurance company, please allow us 1-2 business days to complete this process.  Drug prices often vary depending on where the prescription is filled and some pharmacies may offer cheaper prices.  The website www.goodrx.com contains coupons for medications through different pharmacies. The prices here do not account for what the cost may be with help from insurance (it may be cheaper with your insurance), but the website can give you the price if you did not use any insurance.  - You can print the associated coupon and take it with   your prescription to the pharmacy.  - You may also stop by our office during regular business hours and pick up a GoodRx coupon card.  - If you need your prescription sent electronically to a different pharmacy, notify our office through [facility] MyChart or by phone at [phone] option 4.     Si Usted Necesita Algo Despus de Su Visita  Tambin puede enviarnos un mensaje a travs de MyChart. Por lo general respondemos a los mensajes de MyChart en el transcurso de 1 a 2  das hbiles.  Para renovar recetas, por favor pida a su farmacia que se ponga en contacto con nuestra oficina. Nuestro nmero de fax es el [phone].  Si tiene un asunto urgente cuando la clnica est cerrada y que no puede esperar hasta el siguiente da hbil, puede llamar/localizar a su doctor(a) al nmero que aparece a continuacin.   Por favor, tenga en cuenta que aunque hacemos todo lo posible para estar disponibles para asuntos urgentes fuera del horario de oficina, no estamos disponibles las 24 horas del da, los 7 das de la semana.   Si tiene un problema urgente y no puede comunicarse con nosotros, puede optar por buscar atencin mdica  en el consultorio de su doctor(a), en una clnica privada, en un centro de atencin urgente o en una sala de emergencias.  Si tiene una emergencia mdica, por favor llame inmediatamente al 911 o vaya a la sala de emergencias.  Nmeros de bper  - Dr. Kowalski: [phone]  - Dra. Moye: [phone]  - Dra. Stewart: [phone]  En caso de inclemencias del tiempo, por favor llame a nuestra lnea principal al [phone] para una actualizacin sobre el estado de cualquier retraso o cierre.  Consejos para la medicacin en dermatologa: Por favor, guarde las cajas en las que vienen los medicamentos de uso tpico para ayudarle a seguir las instrucciones sobre dnde y cmo usarlos. Las farmacias generalmente imprimen las instrucciones del medicamento slo en las cajas y no directamente en los tubos del medicamento.   Si su medicamento es muy caro, por favor, pngase en contacto con nuestra oficina llamando al [phone] y presione la opcin 4 o envenos un mensaje a travs de MyChart.   No podemos decirle cul ser su copago por los medicamentos por adelantado ya que esto es diferente dependiendo de la cobertura de su seguro. Sin embargo, es posible que podamos encontrar un medicamento sustituto a menor costo o llenar un formulario para que el  seguro cubra el medicamento que se considera necesario.   Si se requiere una autorizacin previa para que su compaa de seguros cubra su medicamento, por favor permtanos de 1 a 2 das hbiles para completar este proceso.  Los precios de los medicamentos varan con frecuencia dependiendo del lugar de dnde se surte la receta y alguna farmacias pueden ofrecer precios ms baratos.  El sitio web www.goodrx.com tiene cupones para medicamentos de diferentes farmacias. Los precios aqu no tienen en cuenta lo que podra costar con la ayuda del seguro (puede ser ms barato con su seguro), pero el sitio web puede darle el precio si no utiliz ningn seguro.  - Puede imprimir el cupn correspondiente y llevarlo con su receta a la farmacia.  - Tambin puede pasar por nuestra oficina durante el horario de atencin regular y recoger una tarjeta de cupones de GoodRx.  - Si necesita que su receta se enve electrnicamente a una farmacia diferente, informe a nuestra oficina a travs de MyChart de [place]   o por telfono llamando al [phone] y presione la opcin 4.  

## 2021-11-05 ENCOUNTER — Encounter: Payer: Self-pay | Admitting: Dermatology

## 2022-01-27 ENCOUNTER — Ambulatory Visit
Admission: EM | Admit: 2022-01-27 | Discharge: 2022-01-27 | Disposition: A | Discharge status: Home / Self Care | Payer: Medicaid Other | Attending: Urgent Care | Admitting: Urgent Care

## 2022-01-27 DIAGNOSIS — Z7951 Long term (current) use of inhaled steroids: Secondary | ICD-10-CM | POA: Insufficient documentation

## 2022-01-27 DIAGNOSIS — R519 Headache, unspecified: Secondary | ICD-10-CM | POA: Insufficient documentation

## 2022-01-27 DIAGNOSIS — Z113 Encounter for screening for infections with a predominantly sexual mode of transmission: Secondary | ICD-10-CM | POA: Diagnosis not present

## 2022-01-27 DIAGNOSIS — R6889 Other general symptoms and signs: Secondary | ICD-10-CM | POA: Diagnosis not present

## 2022-01-27 DIAGNOSIS — R6883 Chills (without fever): Secondary | ICD-10-CM | POA: Insufficient documentation

## 2022-01-27 DIAGNOSIS — Z1152 Encounter for screening for COVID-19: Secondary | ICD-10-CM | POA: Insufficient documentation

## 2022-01-27 DIAGNOSIS — Z79899 Other long term (current) drug therapy: Secondary | ICD-10-CM | POA: Diagnosis not present

## 2022-01-27 DIAGNOSIS — F17290 Nicotine dependence, other tobacco product, uncomplicated: Secondary | ICD-10-CM | POA: Diagnosis not present

## 2022-01-27 DIAGNOSIS — N926 Irregular menstruation, unspecified: Secondary | ICD-10-CM | POA: Insufficient documentation

## 2022-01-27 DIAGNOSIS — J45909 Unspecified asthma, uncomplicated: Secondary | ICD-10-CM | POA: Insufficient documentation

## 2022-01-27 DIAGNOSIS — R110 Nausea: Secondary | ICD-10-CM | POA: Insufficient documentation

## 2022-01-27 LAB — RESP PANEL BY RT-PCR (FLU A&B, COVID) ARPGX2
Influenza A by PCR: POSITIVE — AB
Influenza B by PCR: NEGATIVE
SARS Coronavirus 2 by RT PCR: NEGATIVE

## 2022-01-27 LAB — POCT URINALYSIS DIP (MANUAL ENTRY)
Bilirubin, UA: NEGATIVE
Glucose, UA: NEGATIVE mg/dL
Leukocytes, UA: NEGATIVE
Nitrite, UA: NEGATIVE
Protein Ur, POC: 100 mg/dL — AB
Spec Grav, UA: 1.025 (ref 1.010–1.025)
Urobilinogen, UA: 1 E.U./dL
pH, UA: 7 (ref 5.0–8.0)

## 2022-01-27 LAB — POCT URINE PREGNANCY: Preg Test, Ur: NEGATIVE

## 2022-01-27 LAB — POCT RAPID STREP A (OFFICE): Rapid Strep A Screen: NEGATIVE

## 2022-01-27 MED ORDER — ONDANSETRON 4 MG PO TBDP: 4.0000 mg | ORAL_TABLET | Freq: Three times a day (TID) | ORAL | 0 refills | Status: DC | PRN

## 2022-01-27 MED ORDER — IBUPROFEN 400 MG PO TABS: 400.0000 mg | ORAL_TABLET | Freq: Once | ORAL | Status: DC

## 2022-01-27 MED ORDER — PROMETHAZINE-DM 6.25-15 MG/5ML PO SYRP: 5.0000 mL | ORAL_SOLUTION | Freq: Four times a day (QID) | ORAL | 0 refills | Status: AC | PRN

## 2022-01-27 MED ORDER — OSELTAMIVIR PHOSPHATE 75 MG PO CAPS: 75.0000 mg | ORAL_CAPSULE | Freq: Two times a day (BID) | ORAL | 0 refills | Status: DC

## 2022-01-27 MED ORDER — BENZONATATE 100 MG PO CAPS: ORAL_CAPSULE | ORAL | 0 refills | Status: DC

## 2022-01-27 NOTE — Discharge Instructions (Addendum)
You have been diagnosed with a viral upper respiratory infection based on your symptoms and exam. Viral illnesses cannot be treated with antibiotics - they are self limiting - and you should find your symptoms resolving within a few days. Get plenty of rest and non-caffeinated fluids.  We have performed a respiratory swab checking for COVID, and influenza. I have prescribed Tamiflu, antiviral therapy for influenza A, based on a presumptive diagnosis of influenza.  Someone will contact you after results of your swab are available with instructions to continue or stop this medication.  We recommend you use over-the-counter medications for symptom control including Tylenol or ibuprofen for fever, chills or body aches, and cold/cough medication.  I have prescribed a prescription cough medication both for daytime and nighttime use.  Saline mist spray is helpful for removing excess mucus from your nose.  Room humidifiers are helpful to ease breathing at night. You might also find relief of nasal/sinus congestion symptoms by using a nasal decongestant such as Flonase (fluticasone) or Sudafed sinus (pseudoephedrine).  You will need to obtain Sudafed from behind the pharmacist counter.  Speak to the pharmacist to verify that you are not duplicating medications with other over-the-counter formulations that you may be using.   Follow up here or with your primary care provider if your symptoms are worsening or not improving.

## 2022-01-27 NOTE — ED Provider Notes (Signed)
[name]    CSN: [id number] Arrival date & time: 01/27/22  1142      History   Chief Complaint No chief complaint on file.   HPI Angela Middleton is a 21 y.o. female.   HPI  Triage by provider.  Presents  to urgent care with complaint of headache and chills starting yesterday.  Endorses body aches but no documented fever.  She also complains of nausea and unable to hold fluids down except for water.  She has been using "liquid IV" in her water.  She requests pregnancy test for missed menses as well as routine STD screening.  She denies any concerning symptoms or increased risk.  PMH including asthma.  Past Medical History:  Diagnosis Date   Asthma     There are no problems to display for this patient.   Past Surgical History:  Procedure Laterality Date   NO PAST SURGERIES      OB History     Gravida  2   Para      Term      Preterm      AB  1   Living         SAB  1   IAB      Ectopic      Multiple      Live Births               Home Medications    Prior to Admission medications   Medication Sig Start Date End Date Taking? Authorizing Provider  albuterol (VENTOLIN HFA) 108 (90 Base) MCG/ACT inhaler Inhale 1-2 puffs into the lungs every 4 (four) hours as needed for wheezing or shortness of breath. Patient not taking: Reported on 11/04/2021 07/02/21   Melynda Ripple, MD  albuterol (VENTOLIN HFA) 108 (90 Base) MCG/ACT inhaler Inhale 1-2 puffs into the lungs every 6 (six) hours as needed for wheezing or shortness of breath. Patient not taking: Reported on 11/04/2021 07/29/21   Danton Clap, PA-C  benzonatate (TESSALON) 200 MG capsule Take 1 capsule (200 mg total) by mouth 3 (three) times daily as needed for cough. Patient not taking: Reported on 11/04/2021 07/29/21   Laurene Footman B, PA-C  fluticasone [facility]) 50 MCG/ACT nasal spray Place 2 sprays into both nostrils daily. Patient not taking: Reported on 11/04/2021 07/02/21    Melynda Ripple, MD  fluticasone-salmeterol (ADVAIR HFA) [phone] MCG/ACT inhaler Inhale 2 puffs into the lungs 2 (two) times daily. Patient not taking: Reported on 11/04/2021 07/29/21   Danton Clap, PA-C  metroNIDAZOLE (FLAGYL) 500 MG tablet Take 1 tablet (500 mg total) by mouth 2 (two) times daily. Patient not taking: Reported on 11/04/2021 08/11/21   Chase Picket, MD  minocycline (MINOCIN) 50 MG capsule Take 1 capsule (50 mg total) by mouth daily. Take in the PM with full meal and plenty of water 11/04/21   Ralene Bathe, MD  mupirocin ointment (BACTROBAN) 2 % Apply to wound QD until healed. 11/04/21   Ralene Bathe, MD  promethazine-dextromethorphan (PROMETHAZINE-DM) 6.25-15 MG/5ML syrup Take 5 mLs by mouth at bedtime as needed for cough. Patient not taking: Reported on 11/04/2021 07/29/21   Laurene Footman B, PA-C  RETIN-A 0.025 % cream Apply topically at bedtime. Patient not taking: Reported on 11/04/2021 04/17/21   Ralene Bathe, MD  valACYclovir (VALTREX) 1000 MG tablet Take 2 tablets (2,000 mg total) by mouth every 12 (twelve) hours. X 1 day Patient not taking: Reported on 11/04/2021 07/02/21  Melynda Ripple, MD  cetirizine (ZYRTEC) 10 MG tablet Take 10 mg by mouth daily.  12/14/19  [provider]  [place] 28 0.25-35 MG-MCG tablet Take 1 tablet by mouth daily. 10/05/19 03/06/20  [provider]    Family History Family History  Problem Relation Age of Onset   Asthma Mother    Healthy Mother    Diabetes Father    Healthy Father    Hypertension Father    High Cholesterol Father    Diabetes Paternal Grandfather    Hypertension Paternal Grandfather    Diabetes Paternal Grandmother    Hypertension Paternal Grandmother    Diabetes Maternal Uncle    Hypertension Maternal Uncle     Social History Social History   Tobacco Use   Smoking status: Never   Smokeless tobacco: Never  Vaping Use   Vaping Use: Some days   Substances: Nicotine, Flavoring   Substance Use Topics   Alcohol use: Yes    Comment: socially   Drug use: Yes    Types: Marijuana    Comment: 4-5 times weekly     Allergies   Shrimp [shellfish allergy]   Review of Systems Review of Systems   Physical Exam Triage Vital Signs ED Triage Vitals  Enc Vitals Group     BP      Pulse      Resp      Temp      Temp src      SpO2      Weight      Height      Head Circumference      Peak Flow      Pain Score      Pain Loc      Pain Edu?      Excl. in [place]?    No data found.  Updated Vital Signs There were no vitals taken for this visit.  Visual Acuity Right Eye Distance:   Left Eye Distance:   Bilateral Distance:    Right Eye Near:   Left Eye Near:    Bilateral Near:     Physical Exam Vitals reviewed.  Constitutional:      Appearance: She is ill-appearing.  HENT:     Mouth/Throat:     Pharynx: No oropharyngeal exudate or posterior oropharyngeal erythema.  Cardiovascular:     Rate and Rhythm: Normal rate and regular rhythm.     Pulses: Normal pulses.     Heart sounds: Normal heart sounds.  Pulmonary:     Effort: Pulmonary effort is normal.     Breath sounds: Normal breath sounds.  Skin:    General: Skin is warm.  Neurological:     General: No focal deficit present.     Mental Status: She is alert and oriented to person, place, and time.  Psychiatric:        Mood and Affect: Mood normal.        Behavior: Behavior normal.      UC Treatments / Results  Labs (all labs ordered are listed, but only abnormal results are displayed) Labs Reviewed - No data to display  EKG   Radiology No results found.  Procedures Procedures (including critical care time)  Medications Ordered in UC Medications - No data to display  Initial Impression / Assessment and Plan / UC Course  I have reviewed the triage vital signs and the nursing notes.  Pertinent labs & imaging results that were available during my care of the patient were reviewed  by me and considered in my medical decision making (see chart for details).   Patient is febrile here with recent antipyretics through cold medication. Satting well on room air, evaded heart rate at 128 bpm.  Ibuprofen 400 mg administered.  Overall is ill appearing, though well hydrated and without respiratory distress. Pulmonary exam is unremarkable.  CTAB without wheezes, rhonchi, rales.  Throat is mildly erythematous without peritonsillar exudates.  Suspect viral process including influenza.  Will treat presumptively for influenza A using Tamiflu while awaiting confirmation of diagnosis.  Also recommending use of OTC medication for symptom control.  Will prescribe benzonatate and Promethazine DM for cough.  Warned patient to look at active ingredients of the medication she is taking to ensure she is not taking greater than recommended dose.  Final Clinical Impressions(s) / UC Diagnoses   Final diagnoses:  None   Discharge Instructions   None    ED Prescriptions   None    PDMP not reviewed this encounter.   Rose Phi,  01/27/22 1321

## 2022-01-28 LAB — CERVICOVAGINAL ANCILLARY ONLY
Bacterial Vaginitis (gardnerella): NEGATIVE
Candida Glabrata: NEGATIVE
Candida Vaginitis: NEGATIVE
Chlamydia: NEGATIVE
Comment: NEGATIVE
Comment: NEGATIVE
Comment: NEGATIVE
Comment: NEGATIVE
Comment: NEGATIVE
Comment: NORMAL
Neisseria Gonorrhea: NEGATIVE
Trichomonas: NEGATIVE

## 2022-01-29 ENCOUNTER — Ambulatory Visit
Admission: EM | Admit: 2022-01-29 | Discharge: 2022-01-29 | Disposition: A | Discharge status: Home / Self Care | Payer: Medicaid Other | Attending: Emergency Medicine | Admitting: Emergency Medicine

## 2022-01-29 DIAGNOSIS — R300 Dysuria: Secondary | ICD-10-CM

## 2022-01-29 DIAGNOSIS — R350 Frequency of micturition: Secondary | ICD-10-CM

## 2022-01-29 LAB — POCT URINALYSIS DIP (MANUAL ENTRY)
Bilirubin, UA: NEGATIVE
Blood, UA: NEGATIVE
Glucose, UA: NEGATIVE mg/dL
Leukocytes, UA: NEGATIVE
Nitrite, UA: NEGATIVE
Protein Ur, POC: 100 mg/dL — AB
Spec Grav, UA: >=1.03 — AB (ref 1.010–1.025)
Urobilinogen, UA: 0.2 E.U./dL
pH, UA: 6 (ref 5.0–8.0)

## 2022-01-29 NOTE — ED Triage Notes (Signed)
Patient to Urgent Care with complaints UTI symptoms. Symptoms started last night.  States frequently having to use the bathroom and only voiding a few drops. No dysuria.   Denies any discharge. Does report some vaginal irritation.  Denies any concern for STD.

## 2022-01-29 NOTE — Discharge Instructions (Addendum)
Continue your current medications.    Increase your water intake.  Your urine does not show signs of infection and your vaginal tests from 2 days ago are negative.    Follow up with your primary care provider if your symptoms are not improving.

## 2022-01-29 NOTE — ED Provider Notes (Signed)
[facility]    CSN: [id number] Arrival date & time: 01/29/22  1455      History   Chief Complaint Chief Complaint  Patient presents with   Urinary Frequency    Think I got a yeast infection keep having use the bathroom - Entered by patient    HPI Angela Middleton is a 21 y.o. female.  Patient presents with dysuria and urinary frequency since last night.  She also reports vaginal irritation.  No fever, chills, abdominal pain, flank pain, pelvic pain, or other symptoms.  Patient was seen at this urgent care on 01/27/2022; diagnosed with STD screening, flu-like symptoms, missed menses; treated with Tamiflu, Zofran, Tessalon Perles, promethazine DM;  positive for influenza A,Urine pregnancy negative, urine negative for infection, cytology negative, strep negative, COVID negative.  Patient reports her flu symptoms have improved.  The history is provided by the patient and medical records.    Past Medical History:  Diagnosis Date   Asthma     There are no problems to display for this patient.   Past Surgical History:  Procedure Laterality Date   NO PAST SURGERIES      OB History     Gravida  2   Para      Term      Preterm      AB  1   Living         SAB  1   IAB      Ectopic      Multiple      Live Births               Home Medications    Prior to Admission medications   Medication Sig Start Date End Date Taking? Authorizing Provider  albuterol (VENTOLIN HFA) 108 (90 Base) MCG/ACT inhaler Inhale 1-2 puffs into the lungs every 4 (four) hours as needed for wheezing or shortness of breath. Patient not taking: Reported on 11/04/2021 07/02/21   Melynda Ripple, MD  albuterol (VENTOLIN HFA) 108 (90 Base) MCG/ACT inhaler Inhale 1-2 puffs into the lungs every 6 (six) hours as needed for wheezing or shortness of breath. Patient not taking: Reported on 11/04/2021 07/29/21   Danton Clap, PA-C  benzonatate (TESSALON) 100 MG capsule Take 1-2  tablets 3 times a day as needed for cough 01/27/22   Immordino, Annie Main, FNP  fluticasone (FLONASE) 50 MCG/ACT nasal spray Place 2 sprays into both nostrils daily. Patient not taking: Reported on 11/04/2021 07/02/21   Melynda Ripple, MD  fluticasone-salmeterol (ADVAIR HFA) [phone] MCG/ACT inhaler Inhale 2 puffs into the lungs 2 (two) times daily. Patient not taking: Reported on 11/04/2021 07/29/21   Danton Clap, PA-C  metroNIDAZOLE (FLAGYL) 500 MG tablet Take 1 tablet (500 mg total) by mouth 2 (two) times daily. Patient not taking: Reported on 11/04/2021 08/11/21   Chase Picket, MD  minocycline (MINOCIN) 50 MG capsule Take 1 capsule (50 mg total) by mouth daily. Take in the PM with full meal and plenty of water 11/04/21   Ralene Bathe, MD  mupirocin ointment (BACTROBAN) 2 % Apply to wound QD until healed. 11/04/21   Ralene Bathe, MD  ondansetron (ZOFRAN-ODT) 4 MG disintegrating tablet Take 1 tablet (4 mg total) by mouth every 8 (eight) hours as needed for nausea or vomiting. 01/27/22   Immordino, Annie Main, FNP  oseltamivir (TAMIFLU) 75 MG capsule Take 1 capsule (75 mg total) by mouth every 12 (twelve) hours. 01/27/22   Immordino, Annie Main, FNP  promethazine-dextromethorphan (PROMETHAZINE-DM) 6.25-15 MG/5ML syrup Take 5 mLs by mouth 4 (four) times daily as needed for up to 7 days for cough. 01/27/22 02/03/22  Immordino, Annie Main, FNP  RETIN-A 0.025 % cream Apply topically at bedtime. Patient not taking: Reported on 11/04/2021 04/17/21   Ralene Bathe, MD  valACYclovir (VALTREX) 1000 MG tablet Take 2 tablets (2,000 mg total) by mouth every 12 (twelve) hours. X 1 day Patient not taking: Reported on 11/04/2021 07/02/21   Melynda Ripple, MD  cetirizine (ZYRTEC) 10 MG tablet Take 10 mg by mouth daily.  12/14/19  [provider]  [place] 28 0.25-35 MG-MCG tablet Take 1 tablet by mouth daily. 10/05/19 03/06/20  [provider]    Family History Family History  Problem  Relation Age of Onset   Asthma Mother    Healthy Mother    Diabetes Father    Healthy Father    Hypertension Father    High Cholesterol Father    Diabetes Paternal Grandfather    Hypertension Paternal Grandfather    Diabetes Paternal Grandmother    Hypertension Paternal Grandmother    Diabetes Maternal Uncle    Hypertension Maternal Uncle     Social History Social History   Tobacco Use   Smoking status: Never   Smokeless tobacco: Never  Vaping Use   Vaping Use: Some days   Substances: Nicotine, Flavoring  Substance Use Topics   Alcohol use: Yes    Comment: socially   Drug use: Yes    Types: Marijuana    Comment: 4-5 times weekly     Allergies   Shrimp [shellfish allergy]   Review of Systems Review of Systems  Constitutional:  Negative for chills and fever.  Gastrointestinal:  Negative for abdominal pain, nausea and vomiting.  Genitourinary:  Positive for dysuria and frequency. Negative for hematuria, pelvic pain and vaginal discharge.  Skin:  Negative for color change and rash.  All other systems reviewed and are negative.    Physical Exam Triage Vital Signs ED Triage Vitals  Enc Vitals Group     BP      Pulse      Resp      Temp      Temp src      SpO2      Weight      Height      Head Circumference      Peak Flow      Pain Score      Pain Loc      Pain Edu?      Excl. in [place]?    No data found.  Updated Vital Signs BP 104/72   Pulse 93   Temp 98.5 F (36.9 C)   Resp 18   Ht '5\' 2"'$  (1.575 m)   Wt 220 lb (99.8 kg)   LMP 01/20/2022   SpO2 97%   BMI 40.24 kg/m   Visual Acuity Right Eye Distance:   Left Eye Distance:   Bilateral Distance:    Right Eye Near:   Left Eye Near:    Bilateral Near:     Physical Exam Vitals and nursing note reviewed.  Constitutional:      General: She is not in acute distress.    Appearance: She is well-developed. She is obese. She is not ill-appearing.  HENT:     Mouth/Throat:     Mouth: Mucous  membranes are moist.  Cardiovascular:     Rate and Rhythm: Normal rate and regular rhythm.  Heart sounds: Normal heart sounds.  Pulmonary:     Effort: Pulmonary effort is normal. No respiratory distress.     Breath sounds: Normal breath sounds.  Abdominal:     General: Bowel sounds are normal.     Palpations: Abdomen is soft.     Tenderness: There is no abdominal tenderness. There is no right CVA tenderness, left CVA tenderness, guarding or rebound.  Musculoskeletal:     Cervical back: Neck supple.  Skin:    General: Skin is warm and dry.  Neurological:     Mental Status: She is alert.  Psychiatric:        Mood and Affect: Mood normal.        Behavior: Behavior normal.      UC Treatments / Results  Labs (all labs ordered are listed, but only abnormal results are displayed) Labs Reviewed  POCT URINALYSIS DIP (MANUAL ENTRY) - Abnormal; Notable for the following components:      Result Value   Ketones, POC UA small (15) (*)    Spec Grav, UA >=1.030 (*)    Protein Ur, POC =100 (*)    All other components within normal limits    EKG   Radiology No results found.  Procedures Procedures (including critical care time)  Medications Ordered in UC Medications - No data to display  Initial Impression / Assessment and Plan / UC Course  I have reviewed the triage vital signs and the nursing notes.  Pertinent labs & imaging results that were available during my care of the patient were reviewed by me and considered in my medical decision making (see chart for details).    Urinary frequency, dysuria.  Urine does not show signs of infection.  Instructed patient to increase her water intake.  Education provided on urinary frequency and dysuria.  Instructed patient to continue her current medications (Tamiflu).  Instructed patient to follow up with her PCP if her symptoms are not improving.  She agrees to plan of care.    Final Clinical Impressions(s) / UC Diagnoses   Final  diagnoses:  Urinary frequency  Dysuria     Discharge Instructions      Continue your current medications.    Increase your water intake.  Your urine does not show signs of infection and your vaginal tests from 2 days ago are negative.    Follow up with your primary care provider if your symptoms are not improving.        ED Prescriptions   None    PDMP not reviewed this encounter.   Sharion Balloon, NP 01/29/22 [phone]

## 2022-02-02 IMAGING — CR DG CHEST 2V
2 series · 2 of 2 positions shown · non-contrast
Comparison: February 12, 2019

CLINICAL DATA: Cough x1 month.

EXAM:
CHEST - 2 VIEW

[chest pa]
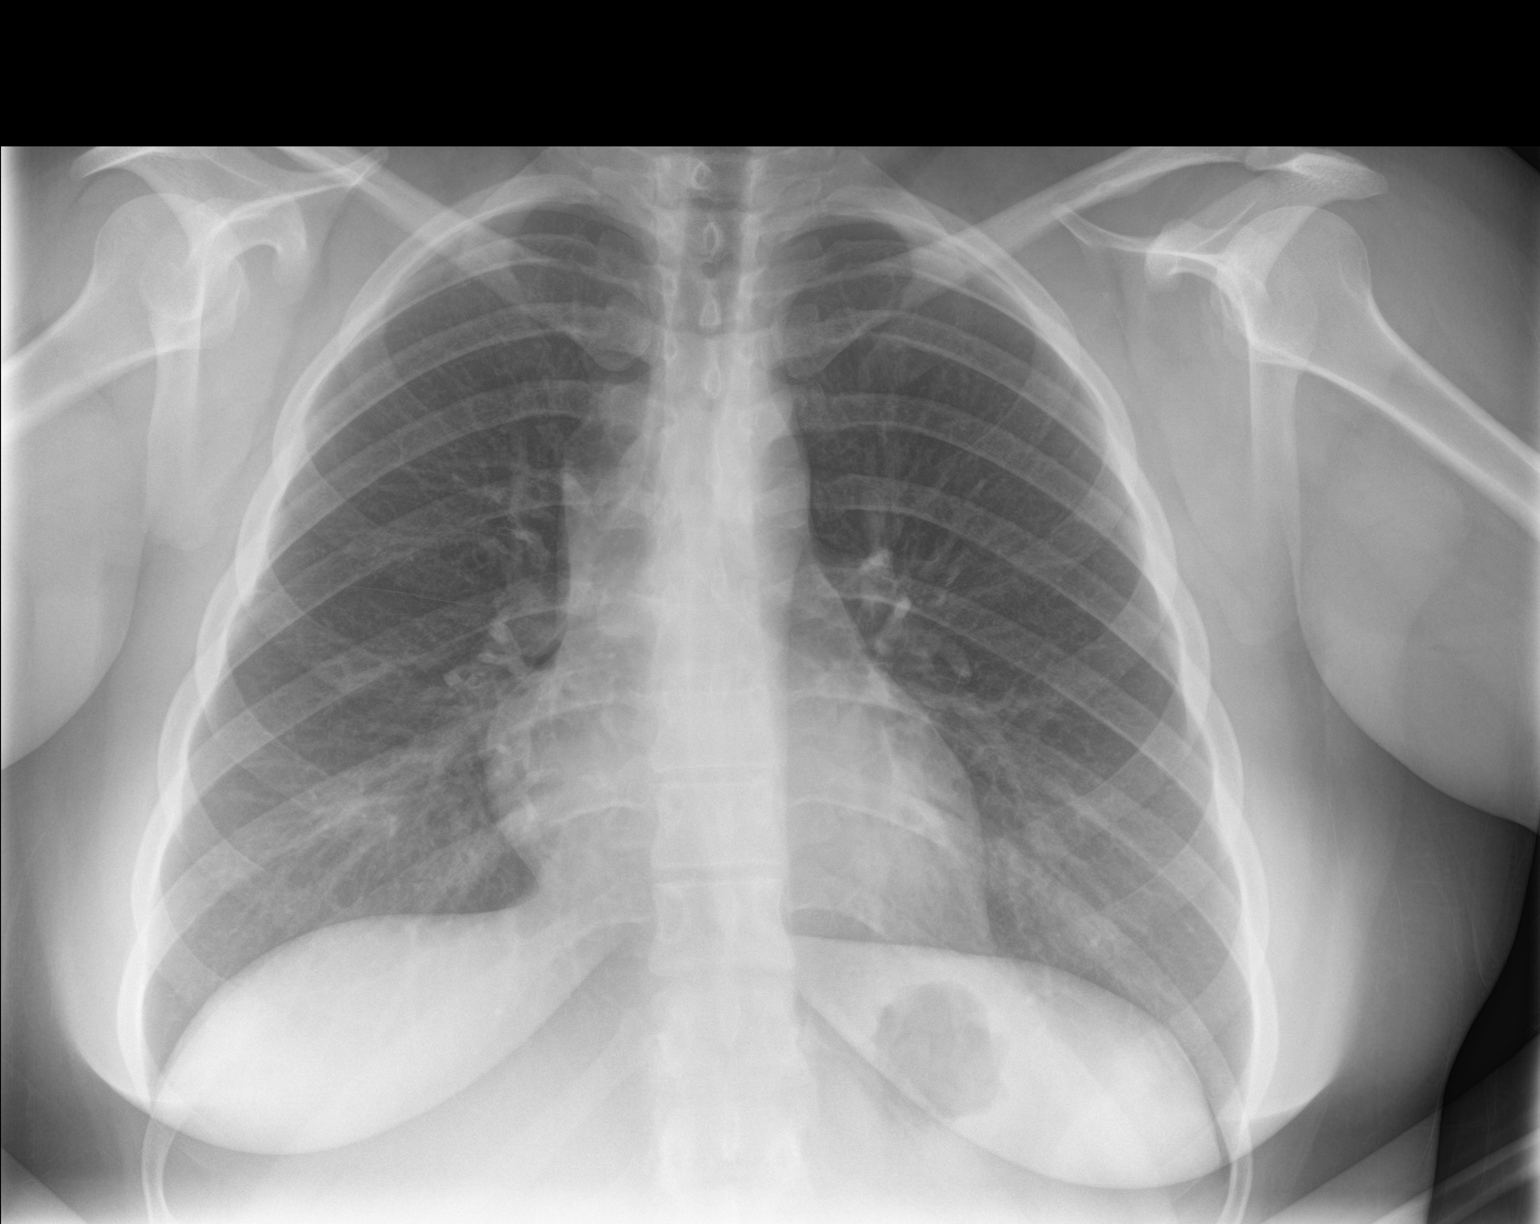

[chest lat]
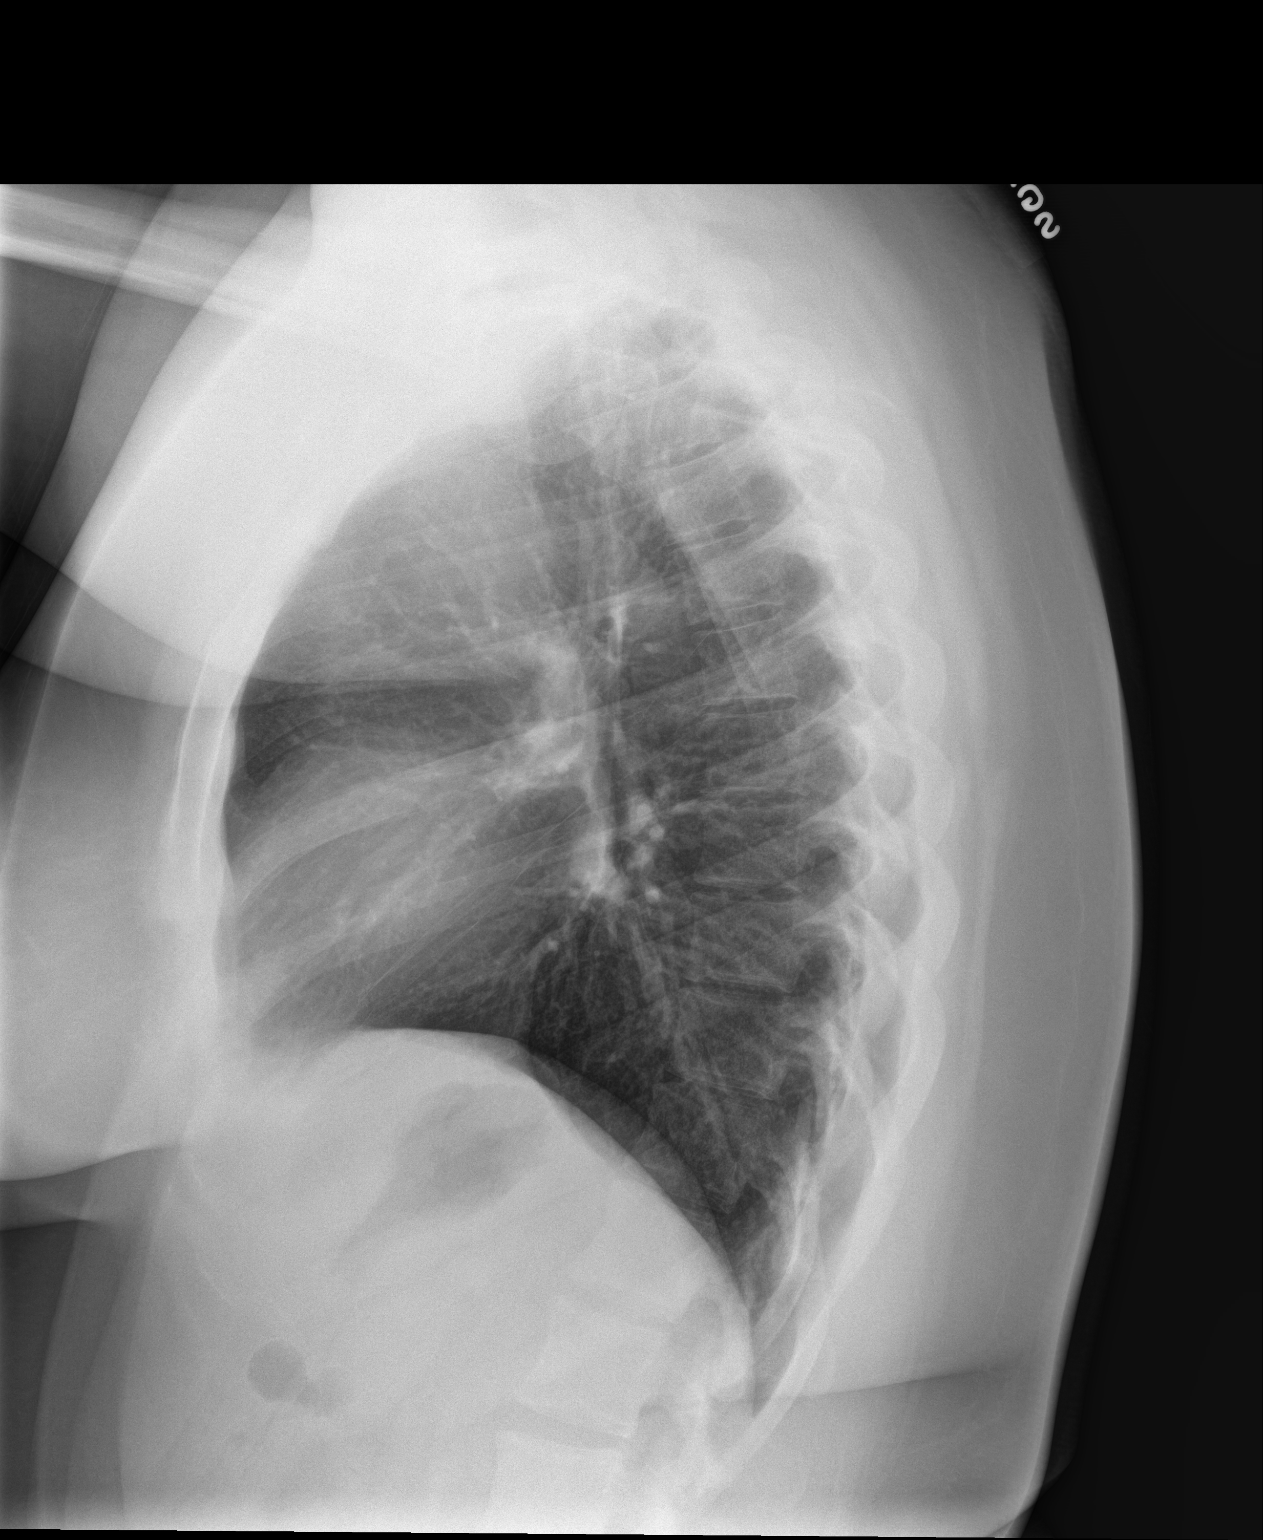

[2 of 2 positions shown; findings below may reference images not displayed]

FINDINGS: The heart size and mediastinal contours are within normal limits.
Both lungs are clear. The visualized skeletal structures are
unremarkable.
IMPRESSION: No active cardiopulmonary disease.

## 2022-02-12 ENCOUNTER — Ambulatory Visit: Payer: Medicaid Other | Admitting: Dermatology

## 2022-03-05 ENCOUNTER — Ambulatory Visit: Payer: Medicaid Other

## 2022-03-17 ENCOUNTER — Ambulatory Visit (LOCAL_COMMUNITY_HEALTH_CENTER): Payer: Medicaid Other

## 2022-03-17 DIAGNOSIS — Z23 Encounter for immunization: Secondary | ICD-10-CM

## 2022-03-17 DIAGNOSIS — Z7185 Encounter for immunization safety counseling: Secondary | ICD-10-CM

## 2022-03-17 NOTE — Progress Notes (Signed)
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 Patient presents copy of negative Hep B titer (03/05/2022) from [facility]. Copy sent for scanning.   Per Epic, pt has hx of completing Hep B vaccine series. Per [facility] Dr Vertell Novak, Hep B vaccine is indicated today and recommendation for repeat Hep B titer in 4 to 8 weeks and if titer shows low immunity at that time, completion of Hep B vaccine series is needed. If repeat Hep B titer shows immunity, no further Hep B vaccines indicated. RN explained this to patient. Questions answered and reports understanding.   Heplisav B vaccine given and tolerated well. Updated [facility] copy given and explained. Josie Saunders, RN

## 2022-03-19 ENCOUNTER — Telehealth: Payer: Medicaid Other | Admitting: Nurse Practitioner

## 2022-03-19 DIAGNOSIS — N3000 Acute cystitis without hematuria: Secondary | ICD-10-CM

## 2022-03-19 DIAGNOSIS — T3695XA Adverse effect of unspecified systemic antibiotic, initial encounter: Secondary | ICD-10-CM | POA: Diagnosis not present

## 2022-03-19 DIAGNOSIS — B379 Candidiasis, unspecified: Secondary | ICD-10-CM

## 2022-03-19 DIAGNOSIS — J4520 Mild intermittent asthma, uncomplicated: Secondary | ICD-10-CM | POA: Diagnosis not present

## 2022-03-19 MED ORDER — ALBUTEROL SULFATE HFA 108 (90 BASE) MCG/ACT IN AERS: 2.0000 | INHALATION_SPRAY | Freq: Four times a day (QID) | RESPIRATORY_TRACT | 0 refills | Status: DC | PRN

## 2022-03-19 MED ORDER — NITROFURANTOIN MONOHYD MACRO 100 MG PO CAPS: 100.0000 mg | ORAL_CAPSULE | Freq: Two times a day (BID) | ORAL | 0 refills | Status: AC

## 2022-03-19 MED ORDER — FLUCONAZOLE 150 MG PO TABS: 150.0000 mg | ORAL_TABLET | Freq: Once | ORAL | 0 refills | Status: AC

## 2022-03-19 NOTE — Progress Notes (Signed)
Virtual Visit Consent   Angela Middleton, you are scheduled for a virtual visit with a [facility] provider today. Just as with appointments in the office, your consent must be obtained to participate. Your consent will be active for this visit and any virtual visit you may have with one of our providers in the next 365 days. If you have a MyChart account, a copy of this consent can be sent to you electronically.  As this is a virtual visit, video technology does not allow for your provider to perform a traditional examination. This may limit your provider's ability to fully assess your condition. If your provider identifies any concerns that need to be evaluated in person or the need to arrange testing (such as labs, EKG, etc.), we will make arrangements to do so. Although advances in technology are sophisticated, we cannot ensure that it will always work on either your end or our end. If the connection with a video visit is poor, the visit may have to be switched to a telephone visit. With either a video or telephone visit, we are not always able to ensure that we have a secure connection.  By engaging in this virtual visit, you consent to the provision of healthcare and authorize for your insurance to be billed (if applicable) for the services provided during this visit. Depending on your insurance coverage, you may receive a charge related to this service.  I need to obtain your verbal consent now. Are you willing to proceed with your visit today? Angela Middleton has provided verbal consent on 03/19/2022 for a virtual visit (video or telephone). Apolonio Schneiders, FNP  Date: 03/19/2022 10:22 AM  Virtual Visit via Video Note   I, Apolonio Schneiders, connected with  Angela Middleton  ([id number], Jun 10, 2000) on 03/19/22 at 10:30 AM EST by a video-enabled telemedicine application and verified that I am speaking with the correct person using two identifiers.  Location: Patient: Virtual Visit Location Patient:  Home Provider: Virtual Visit Location Provider: Home Office   I discussed the limitations of evaluation and management by telemedicine and the availability of in person appointments. The patient expressed understanding and agreed to proceed.    History of Present Illness: Angela Middleton is a 22 y.o. who identifies as a female who was assigned female at birth, and is being seen today for urinary frequency.  Last night she started having a burning sensation with urinating and inability to fully void  She does also note a vaginal odor without noted discharge   She has had symptoms for three days  She does have a PCP has not been seen in the past year has been using UC   Most recent UTI was 2022 She has been positive for BV and yeast prior (most recently 07/2021).   Problems: There are no problems to display for this patient.   Allergies:  Allergies  Allergen Reactions   Shrimp [Shellfish Allergy] Nausea And Vomiting   Medications:  Current Outpatient Medications:    albuterol (VENTOLIN HFA) 108 (90 Base) MCG/ACT inhaler, Inhale 1-2 puffs into the lungs every 4 (four) hours as needed for wheezing or shortness of breath. (Patient not taking: Reported on 11/04/2021), Disp: 1 each, Rfl: 0   albuterol (VENTOLIN HFA) 108 (90 Base) MCG/ACT inhaler, Inhale 1-2 puffs into the lungs every 6 (six) hours as needed for wheezing or shortness of breath. (Patient not taking: Reported on 11/04/2021), Disp: 1 g, Rfl: 1   benzonatate (TESSALON) 100 MG capsule, Take 1-2 tablets  3 times a day as needed for cough (Patient not taking: Reported on 03/17/2022), Disp: 30 capsule, Rfl: 0   fluticasone (FLONASE) 50 MCG/ACT nasal spray, Place 2 sprays into both nostrils daily. (Patient not taking: Reported on 11/04/2021), Disp: 16 g, Rfl: 0   fluticasone-salmeterol (ADVAIR HFA) 115-21 MCG/ACT inhaler, Inhale 2 puffs into the lungs 2 (two) times daily., Disp: 1 each, Rfl: 1   metroNIDAZOLE (FLAGYL) 500 MG tablet, Take 1  tablet (500 mg total) by mouth 2 (two) times daily. (Patient not taking: Reported on 11/04/2021), Disp: 14 tablet, Rfl: 0   minocycline (MINOCIN) 50 MG capsule, Take 1 capsule (50 mg total) by mouth daily. Take in the PM with full meal and plenty of water (Patient not taking: Reported on 03/17/2022), Disp: 30 capsule, Rfl: 2   mupirocin ointment (BACTROBAN) 2 %, Apply to wound QD until healed. (Patient not taking: Reported on 03/17/2022), Disp: 22 g, Rfl: 0   ondansetron (ZOFRAN-ODT) 4 MG disintegrating tablet, Take 1 tablet (4 mg total) by mouth every 8 (eight) hours as needed for nausea or vomiting. (Patient not taking: Reported on 03/17/2022), Disp: 20 tablet, Rfl: 0   oseltamivir (TAMIFLU) 75 MG capsule, Take 1 capsule (75 mg total) by mouth every 12 (twelve) hours. (Patient not taking: Reported on 03/17/2022), Disp: 10 capsule, Rfl: 0   RETIN-A 0.025 % cream, Apply topically at bedtime. (Patient not taking: Reported on 11/04/2021), Disp: 45 g, Rfl: 4   valACYclovir (VALTREX) 1000 MG tablet, Take 2 tablets (2,000 mg total) by mouth every 12 (twelve) hours. X 1 day (Patient not taking: Reported on 11/04/2021), Disp: 12 tablet, Rfl: 0  Observations/Objective: Patient is well-developed, well-nourished in no acute distress.  Resting comfortably  at home.  Head is normocephalic, atraumatic.  No labored breathing.  Speech is clear and coherent with logical content.  Patient is alert and oriented at baseline.    Assessment and Plan: 1. Acute cystitis without hematuria  - nitrofurantoin, macrocrystal-monohydrate, (MACROBID) 100 MG capsule; Take 1 capsule (100 mg total) by mouth 2 (two) times daily for 5 days.  Dispense: 10 capsule; Refill: 0  2. Antibiotic-induced yeast infection  - fluconazole (DIFLUCAN) 150 MG tablet; Take 1 tablet (150 mg total) by mouth once for 1 dose.  Dispense: 1 tablet; Refill: 0  3. Mild intermittent asthma, unspecified whether complicated  - albuterol (VENTOLIN HFA) 108 (90  Base) MCG/ACT inhaler; Inhale 2 puffs into the lungs every 6 (six) hours as needed for wheezing or shortness of breath.  Dispense: 8 g; Refill: 0     Follow Up Instructions: I discussed the assessment and treatment plan with the patient. The patient was provided an opportunity to ask questions and all were answered. The patient agreed with the plan and demonstrated an understanding of the instructions.  A copy of instructions were sent to the patient via MyChart unless otherwise noted below.    The patient was advised to call back or seek an in-person evaluation if the symptoms worsen or if the condition fails to improve as anticipated.  Time:  I spent 9 minutes with the patient via telehealth technology discussing the above problems/concerns.    Apolonio Schneiders, FNP

## 2022-04-01 ENCOUNTER — Ambulatory Visit (INDEPENDENT_AMBULATORY_CARE_PROVIDER_SITE_OTHER): Payer: Medicaid Other | Admitting: Dermatology

## 2022-04-01 DIAGNOSIS — Z79899 Other long term (current) drug therapy: Secondary | ICD-10-CM

## 2022-04-01 DIAGNOSIS — L732 Hidradenitis suppurativa: Secondary | ICD-10-CM

## 2022-04-01 DIAGNOSIS — D2239 Melanocytic nevi of other parts of face: Secondary | ICD-10-CM

## 2022-04-01 DIAGNOSIS — Z7189 Other specified counseling: Secondary | ICD-10-CM

## 2022-04-01 DIAGNOSIS — D492 Neoplasm of unspecified behavior of bone, soft tissue, and skin: Secondary | ICD-10-CM

## 2022-04-01 MED ORDER — MINOCYCLINE HCL 50 MG PO CAPS: 50.0000 mg | ORAL_CAPSULE | Freq: Every day | ORAL | 1 refills | Status: DC

## 2022-04-01 NOTE — Patient Instructions (Addendum)
Wound Care Instructions  Cleanse wound gently with soap and water once a day then pat dry with clean gauze. Apply a thin coat of Petrolatum (petroleum jelly, "Vaseline") over the wound (unless you have an allergy to this). We recommend that you use a new, sterile tube of Vaseline. Do not pick or remove scabs. Do not remove the yellow or white "healing tissue" from the base of the wound.  Cover the wound with fresh, clean, nonstick gauze and secure with paper tape. You may use Band-Aids in place of gauze and tape if the wound is small enough, but would recommend trimming much of the tape off as there is often too much. Sometimes Band-Aids can irritate the skin.  You should call the office for your biopsy report after 1 week if you have not already been contacted.  If you experience any problems, such as abnormal amounts of bleeding, swelling, significant bruising, significant pain, or evidence of infection, please call the office immediately.  FOR ADULT SURGERY PATIENTS: If you need something for pain relief you may take 1 extra strength Tylenol (acetaminophen) AND 2 Ibuprofen (200mg each) together every 4 hours as needed for pain. (do not take these if you are allergic to them or if you have a reason you should not take them.) Typically, you may only need pain medication for 1 to 3 days.     Due to recent changes in healthcare laws, you may see results of your pathology and/or laboratory studies on MyChart before the doctors have had a chance to review them. We understand that in some cases there may be results that are confusing or concerning to you. Please understand that not all results are received at the same time and often the doctors may need to interpret multiple results in order to provide you with the best plan of care or course of treatment. Therefore, we ask that you please give us 2 business days to thoroughly review all your results before contacting the office for clarification. Should  we see a critical lab result, you will be contacted sooner.   If You Need Anything After Your Visit  If you have any questions or concerns for your doctor, please call our main line at [phone] and press option 4 to reach your doctor's medical assistant. If no one answers, please leave a voicemail as directed and we will return your call as soon as possible. Messages left after 4 pm will be answered the following business day.   You may also send us a message via MyChart. We typically respond to MyChart messages within 1-2 business days.  For prescription refills, please ask your pharmacy to contact our office. Our fax number is [phone].  If you have an urgent issue when the clinic is closed that cannot wait until the next business day, you can page your doctor at the number below.    Please note that while we do our best to be available for urgent issues outside of office hours, we are not available 24/7.   If you have an urgent issue and are unable to reach us, you may choose to seek medical care at your doctor's office, retail clinic, urgent care center, or emergency room.  If you have a medical emergency, please immediately call 911 or go to the emergency department.  Pager Numbers  - Dr. Kowalski: [phone]  - Dr. Moye: [phone]  - Dr. Stewart: [phone]  In the event of inclement weather, please call our main line at   [phone] for an update on the status of any delays or closures.  Dermatology Medication Tips: Please keep the boxes that topical medications come in in order to help keep track of the instructions about where and how to use these. Pharmacies typically print the medication instructions only on the boxes and not directly on the medication tubes.   If your medication is too expensive, please contact our office at [phone] option 4 or send us a message through MyChart.   We are unable to tell what your co-pay for medications will be in  advance as this is different depending on your insurance coverage. However, we may be able to find a substitute medication at lower cost or fill out paperwork to get insurance to cover a needed medication.   If a prior authorization is required to get your medication covered by your insurance company, please allow us 1-2 business days to complete this process.  Drug prices often vary depending on where the prescription is filled and some pharmacies may offer cheaper prices.  The website www.goodrx.com contains coupons for medications through different pharmacies. The prices here do not account for what the cost may be with help from insurance (it may be cheaper with your insurance), but the website can give you the price if you did not use any insurance.  - You can print the associated coupon and take it with your prescription to the pharmacy.  - You may also stop by our office during regular business hours and pick up a GoodRx coupon card.  - If you need your prescription sent electronically to a different pharmacy, notify our office through [facility] MyChart or by phone at [phone] option 4.     Si Usted Necesita Algo Despus de Su Visita  Tambin puede enviarnos un mensaje a travs de MyChart. Por lo general respondemos a los mensajes de MyChart en el transcurso de 1 a 2 das hbiles.  Para renovar recetas, por favor pida a su farmacia que se ponga en contacto con nuestra oficina. Nuestro nmero de fax es el [phone].  Si tiene un asunto urgente cuando la clnica est cerrada y que no puede esperar hasta el siguiente da hbil, puede llamar/localizar a su doctor(a) al nmero que aparece a continuacin.   Por favor, tenga en cuenta que aunque hacemos todo lo posible para estar disponibles para asuntos urgentes fuera del horario de oficina, no estamos disponibles las 24 horas del da, los 7 das de la semana.   Si tiene un problema urgente y no puede comunicarse con nosotros, puede  optar por buscar atencin mdica  en el consultorio de su doctor(a), en una clnica privada, en un centro de atencin urgente o en una sala de emergencias.  Si tiene una emergencia mdica, por favor llame inmediatamente al 911 o vaya a la sala de emergencias.  Nmeros de bper  - Dr. Kowalski: [phone]  - Dra. Moye: [phone]  - Dra. Stewart: [phone]  En caso de inclemencias del tiempo, por favor llame a nuestra lnea principal al [phone] para una actualizacin sobre el estado de cualquier retraso o cierre.  Consejos para la medicacin en dermatologa: Por favor, guarde las cajas en las que vienen los medicamentos de uso tpico para ayudarle a seguir las instrucciones sobre dnde y cmo usarlos. Las farmacias generalmente imprimen las instrucciones del medicamento slo en las cajas y no directamente en los tubos del medicamento.   Si su medicamento es muy caro, por favor, pngase en contacto con   nuestra oficina llamando al [phone] y presione la opcin 4 o envenos un mensaje a travs de MyChart.   No podemos decirle cul ser su copago por los medicamentos por adelantado ya que esto es diferente dependiendo de la cobertura de su seguro. Sin embargo, es posible que podamos encontrar un medicamento sustituto a menor costo o llenar un formulario para que el seguro cubra el medicamento que se considera necesario.   Si se requiere una autorizacin previa para que su compaa de seguros cubra su medicamento, por favor permtanos de 1 a 2 das hbiles para completar este proceso.  Los precios de los medicamentos varan con frecuencia dependiendo del lugar de dnde se surte la receta y alguna farmacias pueden ofrecer precios ms baratos.  El sitio web www.goodrx.com tiene cupones para medicamentos de diferentes farmacias. Los precios aqu no tienen en cuenta lo que podra costar con la ayuda del seguro (puede ser ms barato con su seguro), pero el sitio web puede darle el  precio si no utiliz ningn seguro.  - Puede imprimir el cupn correspondiente y llevarlo con su receta a la farmacia.  - Tambin puede pasar por nuestra oficina durante el horario de atencin regular y recoger una tarjeta de cupones de GoodRx.  - Si necesita que su receta se enve electrnicamente a una farmacia diferente, informe a nuestra oficina a travs de MyChart de [place] o por telfono llamando al [phone] y presione la opcin 4.  

## 2022-04-01 NOTE — Progress Notes (Signed)
   Follow-Up Visit   Subjective  Angela [facility] is a 22 y.o. female who presents for the following: Follow-up (4 months f/u on HS treating with otc topicals with a fair response, patient stopped Minocin tablet several months ago) and Skin Problem (Check a growth on the right lower lip since birth changing. ).  The following portions of the chart were reviewed this encounter and updated as appropriate:   Tobacco  Allergies  Meds  Problems  Med Hx  Surg Hx  Fam Hx     Review of Systems:  No other skin or systemic complaints except as noted in HPI or Assessment and Plan.  Objective  Well appearing patient in no apparent distress; mood and affect are within normal limits.  A focused examination was performed including face,lip,arms. Relevant physical exam findings are noted in the Assessment and Plan.  Axilla, groin, buttocks, genital Scarring and pink macules of the axilla. PIPA and scarring of the inner thighs.       right lower lip 0.6 cm flesh papule       Assessment & Plan  Hidradenitis suppurativa Axilla, groin, buttocks, genital Hidradenitis Suppurativa is a chronic; persistent; non-curable, but treatable condition due to abnormal inflamed sweat glands in the body folds (axilla, inframammary, groin, medial thighs), causing recurrent painful draining cysts and scarring. It can be associated with severe scarring acne and cysts; also abscesses and scarring of scalp. The goal is control and prevention of flares, as it is not curable. Scars are permanent and can be thickened. Treatment may include daily use of topical medication and oral antibiotics.  Oral isotretinoin may also be helpful.  For some cases, Humira or Cosentyx (biologic injections) may be prescribed to decrease the inflammatory process and prevent flares.  When indicated, inflamed cysts may also be treated surgically,   Restart Minocycline 50 mg 1 tablet once a day    Related Medications minocycline (MINOCIN)  50 MG capsule Take 1 capsule (50 mg total) by mouth daily.  Neoplasm of skin right lower lip Epidermal / dermal shaving  Lesion diameter (cm):  0.6 Informed consent: discussed and consent obtained   Patient was prepped and draped in usual sterile fashion: area prepped with alcohol. Anesthesia: the lesion was anesthetized in a standard fashion   Anesthetic:  1% lidocaine w/ epinephrine 1-100,000 buffered w/ 8.4% NaHCO3 Instrument used: flexible razor blade   Hemostasis achieved with: pressure, aluminum chloride and electrodesiccation   Outcome: patient tolerated procedure well   Post-procedure details: wound care instructions given   Post-procedure details comment:  Ointment and small bandage applied  Specimen 1 - Surgical pathology Differential Diagnosis: Irritated nevus R/O Dysplastic nevus  Check Margins: No Discussed scarring, re pigmentation and recurrence possible after shave removal   Return in about 3 months (around 06/30/2022) for Hidradenitis suppurativa.  IMarye Round, CMA, am acting as scribe for Sarina Ser, MD .  Documentation: I have reviewed the above documentation for accuracy and completeness, and I agree with the above.  Sarina Ser, MD

## 2022-04-07 ENCOUNTER — Encounter: Payer: Self-pay | Admitting: Dermatology

## 2022-04-07 ENCOUNTER — Telehealth: Payer: Self-pay

## 2022-04-07 NOTE — Telephone Encounter (Signed)
-----   Message from Ralene Bathe, MD sent at 04/05/2022  6:47 PM EST ----- Diagnosis Skin , right lower lip MELANOCYTIC NEVUS, IRRITATED  Benign irritated mole No further treatment needed May re-color and even potential re-grow Recheck next visit

## 2022-04-07 NOTE — Telephone Encounter (Signed)
Left voice mail to return my call.

## 2022-05-12 ENCOUNTER — Telehealth: Payer: Self-pay

## 2022-05-12 NOTE — Telephone Encounter (Signed)
-----   Message from David C Kowalski, MD sent at 04/05/2022  6:47 PM EST ----- Diagnosis Skin , right lower lip MELANOCYTIC NEVUS, IRRITATED  Benign irritated mole No further treatment needed May re-color and even potential re-grow Recheck next visit 

## 2022-05-12 NOTE — Telephone Encounter (Signed)
I spoke with patient but she was busy, and states that she will call me back.

## 2022-07-23 ENCOUNTER — Ambulatory Visit: Payer: Medicaid Other | Admitting: Dermatology

## 2022-10-01 ENCOUNTER — Ambulatory Visit: Payer: Medicaid Other | Admitting: Dermatology

## 2022-10-13 ENCOUNTER — Telehealth: Payer: Medicaid Other | Admitting: Physician Assistant

## 2022-10-13 DIAGNOSIS — Z760 Encounter for issue of repeat prescription: Secondary | ICD-10-CM | POA: Diagnosis not present

## 2022-10-13 DIAGNOSIS — R3989 Other symptoms and signs involving the genitourinary system: Secondary | ICD-10-CM | POA: Diagnosis not present

## 2022-10-13 MED ORDER — VENTOLIN HFA 108 (90 BASE) MCG/ACT IN AERS: 1.0000 | INHALATION_SPRAY | Freq: Four times a day (QID) | RESPIRATORY_TRACT | 0 refills | Status: DC | PRN

## 2022-10-13 MED ORDER — CEPHALEXIN 500 MG PO CAPS: 500.0000 mg | ORAL_CAPSULE | Freq: Two times a day (BID) | ORAL | 0 refills | Status: AC

## 2022-10-13 NOTE — Patient Instructions (Signed)
Angela [facility], thank you for joining Angela Climes, PA-C for today's virtual visit.  While this provider is not your primary care provider (PCP), if your PCP is located in our provider database this encounter information will be shared with them immediately following your visit.   [facility] account gives you access to today's visit and all your visits, tests, and labs performed at [facility] " click here if you don't have [facility] account or go to [email or web address]  Consent: (Patient) Angela [facility] provided verbal consent for this virtual visit at the beginning of the encounter.  Current Medications:  Current Outpatient Medications:    albuterol (VENTOLIN HFA) 108 (90 Base) MCG/ACT inhaler, Inhale 2 puffs into the lungs every 6 (six) hours as needed for wheezing or shortness of breath., Disp: 8 g, Rfl: 0   fluticasone-salmeterol (ADVAIR HFA) 115-21 MCG/ACT inhaler, Inhale 2 puffs into the lungs 2 (two) times daily., Disp: 1 each, Rfl: 1   minocycline (MINOCIN) 50 MG capsule, Take 1 capsule (50 mg total) by mouth daily., Disp: 90 capsule, Rfl: 1   Medications ordered in this encounter:  No orders of the defined types were placed in this encounter.    *If you need refills on other medications prior to your next appointment, please contact your pharmacy*  Follow-Up: Call back or seek an in-person evaluation if the symptoms worsen or if the condition fails to improve as anticipated.  [facility] [phone]  Other Instructions Your symptoms are consistent with Angela bladder infection, also called acute cystitis. Please take your antibiotic (Keflex) as directed until all pills are gone.  Stay very well hydrated.  Consider Angela daily probiotic (Align, Culturelle, or Activia) to help prevent stomach upset caused by the antibiotic.  Taking Angela probiotic daily may also help prevent recurrent UTIs.  Also consider taking AZO  (Phenazopyridine) tablets to help decrease pain with urination.    Urinary Tract Infection Angela urinary tract infection (UTI) can occur any place along the urinary tract. The tract includes the kidneys, ureters, bladder, and urethra. Angela type of germ called bacteria often causes Angela UTI. UTIs are often helped with antibiotic medicine.  HOME CARE  If given, take antibiotics as told by your doctor. Finish them even if you start to feel better. Drink enough fluids to keep your pee (urine) clear or pale yellow. Avoid tea, drinks with caffeine, and bubbly (carbonated) drinks. Pee often. Avoid holding your pee in for Angela long time. Pee before and after having sex (intercourse). Wipe from front to back after you poop (bowel movement) if you are Angela woman. Use each tissue only once. GET HELP RIGHT AWAY IF:  You have back pain. You have lower belly (abdominal) pain. You have chills. You feel sick to your stomach (nauseous). You throw up (vomit). Your burning or discomfort with peeing does not go away. You have Angela fever. Your symptoms are not better in 3 days. MAKE SURE YOU:  Understand these instructions. Will watch your condition. Will get help right away if you are not doing well or get worse. Document Released: 07/15/2007 Document Revised: 10/21/2011 Document Reviewed: 08/27/2011 [facility] Patient Information 2015 [place], [place]. This information is not intended to replace advice given to you by your health care provider. Make sure you discuss any questions you have with your health care provider.    If you have been instructed to have an in-person evaluation today at Angela local Urgent Care facility, please use  the link below. It will take you to Angela list of all of our available [facility], including address, phone number and hours of operation. Please do not delay care.  [facility]  If you or Angela family member do not have Angela primary care provider, use the link below to schedule Angela  visit and establish care. When you choose [facility] primary care physician or advanced practice provider, you gain Angela long-term partner in health. Find Angela Primary Care Provider  Learn more about [facility] in-office and virtual care options:  - Get Care Now

## 2022-10-13 NOTE — Progress Notes (Signed)
Virtual Visit Consent   Jesa Matters, you are scheduled for a virtual visit with a [facility] provider today. Just as with appointments in the office, your consent must be obtained to participate. Your consent will be active for this visit and any virtual visit you may have with one of our providers in the next 365 days. If you have a MyChart account, a copy of this consent can be sent to you electronically.  As this is a virtual visit, video technology does not allow for your provider to perform a traditional examination. This may limit your provider's ability to fully assess your condition. If your provider identifies any concerns that need to be evaluated in person or the need to arrange testing (such as labs, EKG, etc.), we will make arrangements to do so. Although advances in technology are sophisticated, we cannot ensure that it will always work on either your end or our end. If the connection with a video visit is poor, the visit may have to be switched to a telephone visit. With either a video or telephone visit, we are not always able to ensure that we have a secure connection.  By engaging in this virtual visit, you consent to the provision of healthcare and authorize for your insurance to be billed (if applicable) for the services provided during this visit. Depending on your insurance coverage, you may receive a charge related to this service.  I need to obtain your verbal consent now. Are you willing to proceed with your visit today? Angela [facility] has provided verbal consent on 10/13/2022 for a virtual visit (video or telephone). Piedad Climes, [place]  Date: 10/13/2022 7:14 PM  Virtual Visit via Video Note   I, Piedad Climes, connected with  Angela [facility]  ([id number], 26-Sep-2000) on 10/13/22 at  7:15 PM EDT by a video-enabled telemedicine application and verified that I am speaking with the correct person using two identifiers.  Location: Patient: Virtual Visit  Location Patient: Home Provider: Virtual Visit Location Provider: Home Office   I discussed the limitations of evaluation and management by telemedicine and the availability of in person appointments. The patient expressed understanding and agreed to proceed.    History of Present Illness: Angela [facility] is a 22 y.o. who identifies as a female who was assigned female at birth, and is being seen today for possible UTI. Endorses symptoms starting over the past couple of days with urinary urgency, frequency, mild pain. Denies fever, chills, vomiting. Occasional urinary hesitancy Some low back pain. LMP ended last week. Denies concern for STI.  Also requesting refill of her albuterol inhaler. Has been out of medication. Thankfully only uses on a rare occasion.  HPI: HPI  Problems:  Patient Active Problem List   Diagnosis Date Noted   Pain of joint of left ankle and foot 12/16/2020   Salter-Harris Type I fracture of lower end of fibula 07/07/2012    Allergies:  Allergies  Allergen Reactions   Shrimp [Shellfish Allergy] Nausea And Vomiting   Medications:  Current Outpatient Medications:    albuterol (VENTOLIN HFA) 108 (90 Base) MCG/ACT inhaler, Inhale 1-2 puffs into the lungs every 6 (six) hours as needed for wheezing or shortness of breath., Disp: 18 g, Rfl: 0   cephALEXin (KEFLEX) 500 MG capsule, Take 1 capsule (500 mg total) by mouth 2 (two) times daily for 7 days., Disp: 14 capsule, Rfl: 0   methocarbamol (ROBAXIN) 750 MG tablet, Take 1 tablet 3 times a day by oral route.,  Disp: , Rfl:    fluticasone-salmeterol (ADVAIR HFA) 115-21 MCG/ACT inhaler, Inhale 2 puffs into the lungs 2 (two) times daily., Disp: 1 each, Rfl: 1  Observations/Objective: Patient is well-developed, well-nourished in no acute distress.  Resting comfortably at home.  Head is normocephalic, atraumatic.  No labored breathing. Speech is clear and coherent with logical content.  Patient is alert and oriented at  baseline.   Assessment and Plan: 1. Suspected UTI - cephALEXin (KEFLEX) 500 MG capsule; Take 1 capsule (500 mg total) by mouth 2 (two) times daily for 7 days.  Dispense: 14 capsule; Refill: 0  Classic UTI symptoms with absence of alarm signs or symptoms. Prior history of UTI. Will treat empirically with Keflex for suspected uncomplicated cystitis. Supportive measures and OTC medications reviewed. Strict in-person evaluation precautions discussed.    2. Encounter for medication refill - albuterol (VENTOLIN HFA) 108 (90 Base) MCG/ACT inhaler; Inhale 1-2 puffs into the lungs every 6 (six) hours as needed for wheezing or shortness of breath.  Dispense: 18 g; Refill: 0  Refill sent.   Follow Up Instructions: I discussed the assessment and treatment plan with the patient. The patient was provided an opportunity to ask questions and all were answered. The patient agreed with the plan and demonstrated an understanding of the instructions.  A copy of instructions were sent to the patient via MyChart unless otherwise noted below.   The patient was advised to call back or seek an in-person evaluation if the symptoms worsen or if the condition fails to improve as anticipated.  Time:  I spent 10 minutes with the patient via telehealth technology discussing the above problems/concerns.    Piedad Climes, PA-C

## 2022-10-18 ENCOUNTER — Telehealth: Payer: Medicaid Other | Admitting: Physician Assistant

## 2022-10-18 DIAGNOSIS — B3731 Acute candidiasis of vulva and vagina: Secondary | ICD-10-CM | POA: Diagnosis not present

## 2022-10-18 MED ORDER — FLUCONAZOLE 150 MG PO TABS: 150.0000 mg | ORAL_TABLET | Freq: Every day | ORAL | 0 refills | Status: DC

## 2022-10-18 NOTE — Progress Notes (Signed)
Virtual Visit Consent   Angela Middleton, you are scheduled for a virtual visit with a [facility] provider today. Just as with appointments in the office, your consent must be obtained to participate. Your consent will be active for this visit and any virtual visit you may have with one of our providers in the next 365 days. If you have a MyChart account, a copy of this consent can be sent to you electronically.  As this is a virtual visit, video technology does not allow for your provider to perform a traditional examination. This may limit your provider's ability to fully assess your condition. If your provider identifies any concerns that need to be evaluated in person or the need to arrange testing (such as labs, EKG, etc.), we will make arrangements to do so. Although advances in technology are sophisticated, we cannot ensure that it will always work on either your end or our end. If the connection with a video visit is poor, the visit may have to be switched to a telephone visit. With either a video or telephone visit, we are not always able to ensure that we have a secure connection.  By engaging in this virtual visit, you consent to the provision of healthcare and authorize for your insurance to be billed (if applicable) for the services provided during this visit. Depending on your insurance coverage, you may receive a charge related to this service.  I need to obtain your verbal consent now. Are you willing to proceed with your visit today? Angela Middleton has provided verbal consent on 10/18/2022 for a virtual visit (video or telephone). Angela Fantasia Ward, PA-C  Date: 10/18/2022 4:40 PM  Virtual Visit via Video Note   I, Angela Middleton, connected with  Angela Middleton  ([id number], 2001-01-30) on 10/18/22 at  4:15 PM EDT by a video-enabled telemedicine application and verified that I am speaking with the correct person using two identifiers.  Location: Patient: Virtual Visit Location Patient:  Home Provider: Virtual Visit Location Provider: Home   I discussed the limitations of evaluation and management by telemedicine and the availability of in person appointments. The patient expressed understanding and agreed to proceed.    History of Present Illness: Angela Middleton is a 21 y.o. who identifies as a female who was assigned female at birth, and is being seen today for persistent vaginal irritation and dysuria.  She reports finishing the antibiotic prescribed several days ago for UTI, no improvement.  Complains of increased thick vaginal discharge and irritation. Denies fever, chills, pelvic pain, abdominal pain.Marland Kitchen  HPI: HPI  Problems:  Patient Active Problem List   Diagnosis Date Noted   Pain of joint of left ankle and foot 12/16/2020   Salter-Harris Type I fracture of lower end of fibula 07/07/2012    Allergies:  Allergies  Allergen Reactions   Shrimp [Shellfish Allergy] Nausea And Vomiting   Medications:  Current Outpatient Medications:    fluconazole (DIFLUCAN) 150 MG tablet, Take 1 tablet (150 mg total) by mouth daily., Disp: 1 tablet, Rfl: 0   albuterol (VENTOLIN HFA) 108 (90 Base) MCG/ACT inhaler, Inhale 1-2 puffs into the lungs every 6 (six) hours as needed for wheezing or shortness of breath., Disp: 18 g, Rfl: 0   cephALEXin (KEFLEX) 500 MG capsule, Take 1 capsule (500 mg total) by mouth 2 (two) times daily for 7 days., Disp: 14 capsule, Rfl: 0   fluticasone-salmeterol (ADVAIR HFA) 115-21 MCG/ACT inhaler, Inhale 2 puffs into the lungs 2 (two) times daily.,  Disp: 1 each, Rfl: 1   methocarbamol (ROBAXIN) 750 MG tablet, Take 1 tablet 3 times a day by oral route., Disp: , Rfl:   Observations/Objective: Patient is well-developed, well-nourished in no acute distress.  Resting comfortably at home.  Head is normocephalic, atraumatic.  No labored breathing.  Speech is clear and coherent with logical content.  Patient is alert and oriented at baseline.    Assessment  and Plan: 1. Vaginal yeast infection - fluconazole (DIFLUCAN) 150 MG tablet; Take 1 tablet (150 mg total) by mouth daily.  Dispense: 1 tablet; Refill: 0  Advised in person evaluation if no improvement.   Follow Up Instructions: I discussed the assessment and treatment plan with the patient. The patient was provided an opportunity to ask questions and all were answered. The patient agreed with the plan and demonstrated an understanding of the instructions.  A copy of instructions were sent to the patient via MyChart unless otherwise noted below.     The patient was advised to call back or seek an in-person evaluation if the symptoms worsen or if the condition fails to improve as anticipated.  Time:  I spent 7 minutes with the patient via telehealth technology discussing the above problems/concerns.    Angela Fantasia Ward, PA-C

## 2022-10-18 NOTE — Patient Instructions (Signed)
  Joylene Draft, thank you for joining Tylene Fantasia Ward, PA-C for today's virtual visit.  While this provider is not your primary care provider (PCP), if your PCP is located in our provider database this encounter information will be shared with them immediately following your visit.   A [place] MyChart account gives you access to today's visit and all your visits, tests, and labs performed at [facility] " click here if you don't have a [facility] MyChart account or go to [email or web address]  Consent: (Patient) Angela Middleton provided verbal consent for this virtual visit at the beginning of the encounter.  Current Medications:  Current Outpatient Medications:    fluconazole (DIFLUCAN) 150 MG tablet, Take 1 tablet (150 mg total) by mouth daily., Disp: 1 tablet, Rfl: 0   albuterol (VENTOLIN HFA) 108 (90 Base) MCG/ACT inhaler, Inhale 1-2 puffs into the lungs every 6 (six) hours as needed for wheezing or shortness of breath., Disp: 18 g, Rfl: 0   cephALEXin (KEFLEX) 500 MG capsule, Take 1 capsule (500 mg total) by mouth 2 (two) times daily for 7 days., Disp: 14 capsule, Rfl: 0   fluticasone-salmeterol (ADVAIR HFA) 115-21 MCG/ACT inhaler, Inhale 2 puffs into the lungs 2 (two) times daily., Disp: 1 each, Rfl: 1   methocarbamol (ROBAXIN) 750 MG tablet, Take 1 tablet 3 times a day by oral route., Disp: , Rfl:    Medications ordered in this encounter:  Meds ordered this encounter  Medications   fluconazole (DIFLUCAN) 150 MG tablet    Sig: Take 1 tablet (150 mg total) by mouth daily.    Dispense:  1 tablet    Refill:  0    Order Specific Question:   Supervising Provider    Answer:   Merrilee Jansky [id number]     *If you need refills on other medications prior to your next appointment, please contact your pharmacy*  Follow-Up: Call back or seek an in-person evaluation if the symptoms worsen or if the condition fails to improve as anticipated.  [facility]  [facility] [phone]  Other Instructions Take medication as prescribed.  If no improvement please follow up for in person evaluation.    If you have been instructed to have an in-person evaluation today at a local Urgent Care facility, please use the link below. It will take you to a list of all of our available [place] [facility], including address, phone number and hours of operation. Please do not delay care.  [facility]  If you or a family member do not have a primary care provider, use the link below to schedule a visit and establish care. When you choose a [place] [place] primary care physician or advanced practice provider, you gain a long-term partner in health. Find a Primary Care Provider  Learn more about [facility]'s in-office and virtual care options: [place] - Get Care Now

## 2022-12-06 ENCOUNTER — Telehealth: Payer: Medicaid Other | Admitting: Family

## 2022-12-06 DIAGNOSIS — J069 Acute upper respiratory infection, unspecified: Secondary | ICD-10-CM | POA: Diagnosis not present

## 2022-12-06 DIAGNOSIS — J4520 Mild intermittent asthma, uncomplicated: Secondary | ICD-10-CM

## 2022-12-06 MED ORDER — CETIRIZINE HCL 10 MG PO TABS: 10.0000 mg | ORAL_TABLET | Freq: Every day | ORAL | 1 refills | Status: DC

## 2022-12-06 MED ORDER — ALBUTEROL SULFATE HFA 108 (90 BASE) MCG/ACT IN AERS: 1.0000 | INHALATION_SPRAY | Freq: Four times a day (QID) | RESPIRATORY_TRACT | 0 refills | Status: DC | PRN

## 2022-12-06 MED ORDER — FLUTICASONE PROPIONATE 50 MCG/ACT NA SUSP: 2.0000 | Freq: Every day | NASAL | 6 refills | Status: AC

## 2022-12-06 MED ORDER — PREDNISONE 20 MG PO TABS: 40.0000 mg | ORAL_TABLET | Freq: Every day | ORAL | 0 refills | Status: AC

## 2022-12-06 MED ORDER — BENZONATATE 100 MG PO CAPS: 100.0000 mg | ORAL_CAPSULE | Freq: Three times a day (TID) | ORAL | 0 refills | Status: DC | PRN

## 2022-12-06 NOTE — Progress Notes (Signed)
Virtual Visit Consent   Illya Rostek, you are scheduled for a virtual visit with a [facility] provider today. Just as with appointments in the office, your consent must be obtained to participate. Your consent will be active for this visit and any virtual visit you may have with one of our providers in the next 365 days. If you have a MyChart account, a copy of this consent can be sent to you electronically.  As this is a virtual visit, video technology does not allow for your provider to perform a traditional examination. This may limit your provider's ability to fully assess your condition. If your provider identifies any concerns that need to be evaluated in person or the need to arrange testing (such as labs, EKG, etc.), we will make arrangements to do so. Although advances in technology are sophisticated, we cannot ensure that it will always work on either your end or our end. If the connection with a video visit is poor, the visit may have to be switched to a telephone visit. With either a video or telephone visit, we are not always able to ensure that we have a secure connection.  By engaging in this virtual visit, you consent to the provision of healthcare and authorize for your insurance to be billed (if applicable) for the services provided during this visit. Depending on your insurance coverage, you may receive a charge related to this service.  I need to obtain your verbal consent now. Are you willing to proceed with your visit today? Angela Middleton has provided verbal consent on 12/06/2022 for a virtual visit (video or telephone). Jannifer Rodney, FNP  Date: 12/06/2022 5:58 PM  Virtual Visit via Video Note   I, Jannifer Rodney, connected with  Angela Middleton  ([id number], 29-Apr-2000) on 12/06/22 at  6:00 PM EDT by a video-enabled telemedicine application and verified that I am speaking with the correct person using two identifiers.  Location: Patient: Virtual Visit Location  Patient: Home Provider: Virtual Visit Location Provider: Home Office   I discussed the limitations of evaluation and management by telemedicine and the availability of in person appointments. The patient expressed understanding and agreed to proceed.    History of Present Illness: Angela Middleton is a 22 y.o. who identifies as a female who was assigned female at birth, and is being seen today for cough that started last night.  HPI: URI  This is a new problem. The current episode started yesterday. The problem has been unchanged. Associated symptoms include congestion, coughing, headaches, rhinorrhea and a sore throat. Pertinent negatives include no ear pain, sinus pain or sneezing. She has tried decongestant for the symptoms. The treatment provided mild relief.    Problems:  Patient Active Problem List   Diagnosis Date Noted   Pain of joint of left ankle and foot 12/16/2020   Salter-Harris Type I fracture of lower end of fibula 07/07/2012    Allergies:  Allergies  Allergen Reactions   Shrimp [Shellfish Allergy] Nausea And Vomiting   Medications:  Current Outpatient Medications:    benzonatate (TESSALON PERLES) 100 MG capsule, Take 1 capsule (100 mg total) by mouth 3 (three) times daily as needed., Disp: 20 capsule, Rfl: 0   cetirizine (ZYRTEC ALLERGY) 10 MG tablet, Take 1 tablet (10 mg total) by mouth daily., Disp: 90 tablet, Rfl: 1   fluticasone (FLONASE) 50 MCG/ACT nasal spray, Place 2 sprays into both nostrils daily., Disp: 16 g, Rfl: 6   predniSONE (DELTASONE) 20 MG tablet, Take 2 tablets (  40 mg total) by mouth daily with breakfast for 5 days., Disp: 10 tablet, Rfl: 0   albuterol (VENTOLIN HFA) 108 (90 Base) MCG/ACT inhaler, Inhale 1-2 puffs into the lungs every 6 (six) hours as needed for wheezing or shortness of breath., Disp: 18 g, Rfl: 0  Observations/Objective: Patient is well-developed, well-nourished in no acute distress.  Resting comfortably  at home.  Head is  normocephalic, atraumatic.  No labored breathing.  Speech is clear and coherent with logical content.  Patient is alert and oriented at baseline.  Constant dry cough  Assessment and Plan: 1. Viral URI - fluticasone (FLONASE) 50 MCG/ACT nasal spray; Place 2 sprays into both nostrils daily.  Dispense: 16 g; Refill: 6 - cetirizine (ZYRTEC ALLERGY) 10 MG tablet; Take 1 tablet (10 mg total) by mouth daily.  Dispense: 90 tablet; Refill: 1 - benzonatate (TESSALON PERLES) 100 MG capsule; Take 1 capsule (100 mg total) by mouth 3 (three) times daily as needed.  Dispense: 20 capsule; Refill: 0 - albuterol (VENTOLIN HFA) 108 (90 Base) MCG/ACT inhaler; Inhale 1-2 puffs into the lungs every 6 (six) hours as needed for wheezing or shortness of breath.  Dispense: 18 g; Refill: 0 - predniSONE (DELTASONE) 20 MG tablet; Take 2 tablets (40 mg total) by mouth daily with breakfast for 5 days.  Dispense: 10 tablet; Refill: 0  2. Mild intermittent asthma, unspecified whether complicated - fluticasone (FLONASE) 50 MCG/ACT nasal spray; Place 2 sprays into both nostrils daily.  Dispense: 16 g; Refill: 6 - cetirizine (ZYRTEC ALLERGY) 10 MG tablet; Take 1 tablet (10 mg total) by mouth daily.  Dispense: 90 tablet; Refill: 1 - benzonatate (TESSALON PERLES) 100 MG capsule; Take 1 capsule (100 mg total) by mouth 3 (three) times daily as needed.  Dispense: 20 capsule; Refill: 0 - albuterol (VENTOLIN HFA) 108 (90 Base) MCG/ACT inhaler; Inhale 1-2 puffs into the lungs every 6 (six) hours as needed for wheezing or shortness of breath.  Dispense: 18 g; Refill: 0 - predniSONE (DELTASONE) 20 MG tablet; Take 2 tablets (40 mg total) by mouth daily with breakfast for 5 days.  Dispense: 10 tablet; Refill: 0  - Take meds as prescribed - Use a cool mist humidifier  -Use saline nose sprays frequently -Force fluids -For any cough or congestion  Use plain Mucinex- regular strength or max strength is fine -For fever or aces or pains- take  tylenol or ibuprofen. -Throat lozenges if help -Follow up if symptoms worsen or do not improve   Follow Up Instructions: I discussed the assessment and treatment plan with the patient. The patient was provided an opportunity to ask questions and all were answered. The patient agreed with the plan and demonstrated an understanding of the instructions.  A copy of instructions were sent to the patient via MyChart unless otherwise noted below.    The patient was advised to call back or seek an in-person evaluation if the symptoms worsen or if the condition fails to improve as anticipated.    Jannifer Rodney, FNP

## 2022-12-25 ENCOUNTER — Other Ambulatory Visit: Payer: Self-pay | Admitting: Family

## 2022-12-25 DIAGNOSIS — J4520 Mild intermittent asthma, uncomplicated: Secondary | ICD-10-CM

## 2022-12-25 DIAGNOSIS — J069 Acute upper respiratory infection, unspecified: Secondary | ICD-10-CM

## 2023-04-04 ENCOUNTER — Telehealth: Payer: Medicaid Other | Admitting: Physician Assistant

## 2023-04-04 DIAGNOSIS — R058 Other specified cough: Secondary | ICD-10-CM | POA: Diagnosis not present

## 2023-04-04 DIAGNOSIS — J4520 Mild intermittent asthma, uncomplicated: Secondary | ICD-10-CM

## 2023-04-04 DIAGNOSIS — N760 Acute vaginitis: Secondary | ICD-10-CM

## 2023-04-04 DIAGNOSIS — J069 Acute upper respiratory infection, unspecified: Secondary | ICD-10-CM

## 2023-04-04 MED ORDER — METHYLPREDNISOLONE 4 MG PO TBPK: ORAL_TABLET | ORAL | 0 refills | Status: AC

## 2023-04-04 MED ORDER — FLUCONAZOLE 150 MG PO TABS: 150.0000 mg | ORAL_TABLET | Freq: Once | ORAL | 0 refills | Status: AC

## 2023-04-04 MED ORDER — SALINE SPRAY 0.65 % NA SOLN: 1.0000 | NASAL | 0 refills | Status: AC | PRN

## 2023-04-04 MED ORDER — ALBUTEROL SULFATE HFA 108 (90 BASE) MCG/ACT IN AERS: 1.0000 | INHALATION_SPRAY | Freq: Four times a day (QID) | RESPIRATORY_TRACT | 0 refills | Status: DC | PRN

## 2023-04-04 MED ORDER — BENZONATATE 100 MG PO CAPS: 100.0000 mg | ORAL_CAPSULE | Freq: Three times a day (TID) | ORAL | 0 refills | Status: DC | PRN

## 2023-04-04 NOTE — Progress Notes (Signed)
 Virtual Visit Consent   Angela Middleton, you are scheduled for a virtual visit with a [facility] provider today. Just as with appointments in the office, your consent must be obtained to participate. Your consent will be active for this visit and any virtual visit you may have with one of our providers in the next 365 days. If you have a MyChart account, a copy of this consent can be sent to you electronically.  As this is a virtual visit, video technology does not allow for your provider to perform a traditional examination. This may limit your provider's ability to fully assess your condition. If your provider identifies any concerns that need to be evaluated in person or the need to arrange testing (such as labs, EKG, etc.), we will make arrangements to do so. Although advances in technology are sophisticated, we cannot ensure that it will always work on either your end or our end. If the connection with a video visit is poor, the visit may have to be switched to a telephone visit. With either a video or telephone visit, we are not always able to ensure that we have a secure connection.  By engaging in this virtual visit, you consent to the provision of healthcare and authorize for your insurance to be billed (if applicable) for the services provided during this visit. Depending on your insurance coverage, you may receive a charge related to this service.  I need to obtain your verbal consent now. Are you willing to proceed with your visit today? [name] has provided verbal consent on 04/04/2023 for a virtual visit (video or telephone). [name], [place]  Date: 04/04/2023 2:48 PM   Virtual Visit via Video Note   I, Angela Middleton, connected with  [name]  ([id number], 03-23-2000) on 04/04/23 at  2:45 PM EST by a video-enabled telemedicine application and verified that I am speaking with the correct person using two identifiers.  Location: Patient: Virtual Visit Location  Patient: Home Provider: Virtual Visit Location Provider: Home Office   I discussed the limitations of evaluation and management by telemedicine and the availability of in person appointments. The patient expressed understanding and agreed to proceed.    History of Present Illness: Angela Middleton is a 23 y.o. who identifies as a female who was assigned female at birth, and is being seen today for cough.  HPI: URI  This is a new problem. The current episode started in the past 7 days. There has been no fever. Associated symptoms include congestion and rhinorrhea. She has tried nothing for the symptoms. The treatment provided mild relief.    Problems:  Patient Active Problem List   Diagnosis Date Noted   Pain of joint of left ankle and foot 12/16/2020   Salter-Harris Type I fracture of lower end of fibula 07/07/2012    Allergies:  Allergies  Allergen Reactions   Shrimp [Shellfish Allergy] Nausea And Vomiting   Medications:  Current Outpatient Medications:    albuterol (VENTOLIN HFA) 108 (90 Base) MCG/ACT inhaler, Inhale 1-2 puffs into the lungs every 6 (six) hours as needed for wheezing or shortness of breath., Disp: 18 g, Rfl: 0   benzonatate (TESSALON PERLES) 100 MG capsule, Take 1 capsule (100 mg total) by mouth 3 (three) times daily as needed., Disp: 20 capsule, Rfl: 0   cetirizine (ZYRTEC ALLERGY) 10 MG tablet, Take 1 tablet (10 mg total) by mouth daily., Disp: 90 tablet, Rfl: 1   fluconazole (DIFLUCAN) 150 MG tablet, Take 1 tablet (150  mg total) by mouth once for 1 dose., Disp: 1 tablet, Rfl: 0   fluticasone (FLONASE) 50 MCG/ACT nasal spray, Place 2 sprays into both nostrils daily., Disp: 16 g, Rfl: 6  Observations/Objective: Patient is well-developed, well-nourished in no acute distress.  Resting comfortably  at home.  Head is normocephalic, atraumatic.  No labored breathing.  Speech is clear and coherent with logical content.  Patient is alert and oriented at baseline.     Assessment and Plan: 1. Post-viral cough syndrome (Primary)  2. Viral URI  3. Mild intermittent asthma, unspecified whether complicated  Patient presents with symptoms suspicious for likely viral uri . Differentials include bacterial pneumonia, sinusitis, allergic rhinitis. Do not suspect underlying cardiopulmonary process. I considered, but think unlikely, dangerous causes of this patient's symptoms to include ACS, CHF or COPD exacerbations, pneumonia, pneumothorax. Patient is nontoxic appearing and not in need of emergent medical intervention.  Plan: reassurance, reassessment, over the counter medications, discharge with PCP follow-up  Follow Up Instructions: I discussed the assessment and treatment plan with the patient. The patient was provided an opportunity to ask questions and all were answered. The patient agreed with the plan and demonstrated an understanding of the instructions.  A copy of instructions were sent to the patient via MyChart unless otherwise noted below.    The patient was advised to call back or seek an in-person evaluation if the symptoms worsen or if the condition fails to improve as anticipated.    [name], PA-C

## 2023-04-04 NOTE — Progress Notes (Signed)

## 2023-04-04 NOTE — Patient Instructions (Signed)
  Joylene Draft, thank you for joining [name], PA-C for today's virtual visit.  While this provider is not your primary care provider (PCP), if your PCP is located in our provider database this encounter information will be shared with them immediately following your visit.   A [facility] MyChart account gives you access to today's visit and all your visits, tests, and labs performed at [facility] " click here if you don't have a [facility] MyChart account or go to [email or web address]  Consent: (Patient) Angela Middleton provided verbal consent for this virtual visit at the beginning of the encounter.  Current Medications:  Current Outpatient Medications:    albuterol (VENTOLIN HFA) 108 (90 Base) MCG/ACT inhaler, Inhale 1-2 puffs into the lungs every 6 (six) hours as needed for wheezing or shortness of breath., Disp: 18 g, Rfl: 0   benzonatate (TESSALON PERLES) 100 MG capsule, Take 1 capsule (100 mg total) by mouth 3 (three) times daily as needed., Disp: 20 capsule, Rfl: 0   cetirizine (ZYRTEC ALLERGY) 10 MG tablet, Take 1 tablet (10 mg total) by mouth daily., Disp: 90 tablet, Rfl: 1   fluconazole (DIFLUCAN) 150 MG tablet, Take 1 tablet (150 mg total) by mouth once for 1 dose., Disp: 1 tablet, Rfl: 0   fluticasone (FLONASE) 50 MCG/ACT nasal spray, Place 2 sprays into both nostrils daily., Disp: 16 g, Rfl: 6   Medications ordered in this encounter:  No orders of the defined types were placed in this encounter.    *If you need refills on other medications prior to your next appointment, please contact your pharmacy*  Follow-Up: Call back or seek an in-person evaluation if the symptoms worsen or if the condition fails to improve as anticipated.  [facility] [phone]  Other Instructions Please report to the nearest Emergency room with any worsening symptoms. Follow up with primary care provider (PCP) in 2 -3 days.    If you have been  instructed to have an in-person evaluation today at a local Urgent Care facility, please use the link below. It will take you to a list of all of our available [place] Urgent [facility], including address, phone number and hours of operation. Please do not delay care.  [facility]  If you or a family member do not have a primary care provider, use the link below to schedule a visit and establish care. When you choose a [facility] primary care physician or advanced practice provider, you gain a long-term partner in health. Find a Primary Care Provider  Learn more about [facility]'s in-office and virtual care options: [facility] - Get Care Now

## 2023-04-23 ENCOUNTER — Telehealth: Admitting: Family Medicine

## 2023-04-23 NOTE — Progress Notes (Signed)
 Pt did not show for visit [facility]

## 2023-05-10 ENCOUNTER — Telehealth: Admitting: Physician Assistant

## 2023-05-10 DIAGNOSIS — R062 Wheezing: Secondary | ICD-10-CM

## 2023-05-10 DIAGNOSIS — J4520 Mild intermittent asthma, uncomplicated: Secondary | ICD-10-CM | POA: Diagnosis not present

## 2023-05-10 MED ORDER — FLUTICASONE PROPIONATE HFA 110 MCG/ACT IN AERO: 2.0000 | INHALATION_SPRAY | Freq: Every day | RESPIRATORY_TRACT | 0 refills | Status: AC

## 2023-05-10 MED ORDER — VENTOLIN HFA 108 (90 BASE) MCG/ACT IN AERS: 2.0000 | INHALATION_SPRAY | Freq: Four times a day (QID) | RESPIRATORY_TRACT | 0 refills | Status: DC | PRN

## 2023-05-10 MED ORDER — PREDNISONE 20 MG PO TABS: 20.0000 mg | ORAL_TABLET | Freq: Two times a day (BID) | ORAL | 0 refills | Status: AC

## 2023-05-10 NOTE — Progress Notes (Signed)

## 2023-05-24 ENCOUNTER — Ambulatory Visit: Payer: Medicaid Other | Admitting: Family Medicine

## 2023-06-04 ENCOUNTER — Ambulatory Visit
Admission: RE | Admit: 2023-06-04 | Discharge: 2023-06-04 | Disposition: A | Discharge status: Home / Self Care | Source: Ambulatory Visit | Attending: Emergency Medicine | Admitting: Emergency Medicine

## 2023-06-04 VITALS — BP 114/80 | HR 87 | Temp 98.5°F | Resp 16 | Ht 62.0 in | Wt 220.0 lb

## 2023-06-04 DIAGNOSIS — Z760 Encounter for issue of repeat prescription: Secondary | ICD-10-CM

## 2023-06-04 DIAGNOSIS — J4520 Mild intermittent asthma, uncomplicated: Secondary | ICD-10-CM

## 2023-06-04 DIAGNOSIS — Z8709 Personal history of other diseases of the respiratory system: Secondary | ICD-10-CM

## 2023-06-04 DIAGNOSIS — R1030 Lower abdominal pain, unspecified: Secondary | ICD-10-CM

## 2023-06-04 DIAGNOSIS — N926 Irregular menstruation, unspecified: Secondary | ICD-10-CM | POA: Diagnosis not present

## 2023-06-04 DIAGNOSIS — J069 Acute upper respiratory infection, unspecified: Secondary | ICD-10-CM

## 2023-06-04 LAB — URINALYSIS, W/ REFLEX TO CULTURE (INFECTION SUSPECTED)
Bilirubin Urine: NEGATIVE
Glucose, UA: NEGATIVE mg/dL
Hgb urine dipstick: NEGATIVE
Ketones, ur: NEGATIVE mg/dL
Leukocytes,Ua: NEGATIVE
Nitrite: NEGATIVE
Protein, ur: NEGATIVE mg/dL
Specific Gravity, Urine: 1.02 (ref 1.005–1.030)
pH: 6 (ref 5.0–8.0)

## 2023-06-04 LAB — PREGNANCY, URINE: Preg Test, Ur: NEGATIVE

## 2023-06-04 MED ORDER — CETIRIZINE HCL 10 MG PO TABS: 10.0000 mg | ORAL_TABLET | Freq: Every day | ORAL | 0 refills | Status: DC

## 2023-06-04 MED ORDER — ALBUTEROL SULFATE HFA 108 (90 BASE) MCG/ACT IN AERS: 2.0000 | INHALATION_SPRAY | Freq: Four times a day (QID) | RESPIRATORY_TRACT | 0 refills | Status: DC | PRN

## 2023-06-04 NOTE — ED Triage Notes (Signed)
 Pt c/o lower abdominal pain. She states she is 13-14 days late on her period. She states she took 2 plan Bs last month and took another this past Monday. She states she is also having headaches. Denies nausea or vomiting. She states she has taken 3 home pregnancy test and they have come back negative.    She is also requesting a refill on her albuterol . She has asthma and been having to use her inhaler more recently.

## 2023-06-04 NOTE — ED Provider Notes (Signed)
 [facility]    CSN: [id number] Arrival date & time: 06/04/23  1238      History   Chief Complaint Chief Complaint  Patient presents with   Amenorrhea    HPI Robyne Matar is a 23 y.o. female.   23 year old female pt, Asal Docken, Zentz to urgent care complaining of lower abdominal pain.  Patient states she is 2 weeks late on her.  States she has taken 3 Plan B's in the last month took 3 home pregnancy test that were negative.  Patient is also requesting refill albuterol , takes albuterol  for asthma.  The history is provided by the patient. No language interpreter was used.    Past Medical History:  Diagnosis Date   Asthma     Patient Active Problem List   Diagnosis Date Noted   Lower abdominal pain 06/04/2023   Missed period 06/04/2023   Encounter for medication refill 06/04/2023   History of asthma 06/04/2023   Pain of joint of left ankle and foot 12/16/2020   Salter-Harris Type I fracture of lower end of fibula 07/07/2012    Past Surgical History:  Procedure Laterality Date   NO PAST SURGERIES      OB History     Gravida  2   Para      Term      Preterm      AB  1   Living         SAB  1   IAB      Ectopic      Multiple      Live Births               Home Medications    Prior to Admission medications   Medication Sig Start Date End Date Taking? Authorizing Provider  albuterol  (VENTOLIN  HFA) 108 (90 Base) MCG/ACT inhaler Inhale 2 puffs into the lungs every 6 (six) hours as needed for wheezing or shortness of breath. 06/04/23 07/04/23  Aleigh Grunden, Eveleen Hinds, NP  benzonatate  (TESSALON  PERLES) 100 MG capsule Take 1 capsule (100 mg total) by mouth 3 (three) times daily as needed. 04/04/23   Marciana Settle, PA-C  cetirizine  (ZYRTEC  ALLERGY) 10 MG tablet Take 1 tablet (10 mg total) by mouth daily for 14 days. 06/04/23 06/18/23  Ellon Marasco, NP  fluticasone  (FLONASE ) 50 MCG/ACT nasal spray Place 2 sprays into both  nostrils daily. 12/06/22  Yes Hawks, Christy A, FNP  fluticasone  (FLOVENT  HFA) 110 MCG/ACT inhaler Inhale 2 puffs into the lungs daily. 05/10/23 06/09/23  Blair, Diane W, FNP  methylPREDNISolone  (MEDROL  DOSEPAK) 4 MG TBPK tablet Take as directed. 04/04/23   Marciana Settle, PA-C  sodium chloride (OCEAN) 0.65 % SOLN nasal spray Place 1 spray into both nostrils as needed for up to 5 days for congestion. 04/04/23 04/09/23  Marciana Settle, PA-C  SPRINTEC 28 0.25-35 MG-MCG tablet Take 1 tablet by mouth daily. 10/05/19 03/06/20  [provider]    Family History Family History  Problem Relation Age of Onset   Asthma Mother    Healthy Mother    Diabetes Father    Healthy Father    Hypertension Father    High Cholesterol Father    Diabetes Paternal Grandfather    Hypertension Paternal Grandfather    Diabetes Paternal Grandmother    Hypertension Paternal Grandmother    Diabetes Maternal Uncle    Hypertension Maternal Uncle     Social History Social History   Tobacco Use   Smoking status: Never  Smokeless tobacco: Never  Vaping Use   Vaping status: Some Days   Substances: Nicotine, Flavoring  Substance Use Topics   Alcohol use: Yes    Comment: socially   Drug use: Yes    Types: Marijuana    Comment: 4-5 times weekly     Allergies   Shrimp [shellfish allergy]   Review of Systems Review of Systems  Constitutional:  Negative for fever.  Respiratory:  Negative for cough.   Gastrointestinal:  Positive for abdominal pain. Negative for nausea and vomiting.  Genitourinary:  Positive for menstrual problem.  All other systems reviewed and are negative.    Physical Exam Triage Vital Signs ED Triage Vitals [06/04/23 1306]  Encounter Vitals Group     BP      Systolic BP Percentile      Diastolic BP Percentile      Pulse      Resp      Temp      Temp src      SpO2      Weight 220 lb 0.3 oz (99.8 kg)     Height 5\' 2"  (1.575 m)     Head Circumference      Peak Flow       Pain Score 3     Pain Loc      Pain Education      Exclude from Growth Chart    No data found.  Updated Vital Signs BP 114/80 (BP Location: Right Arm)   Pulse 87   Temp 98.5 F (36.9 C) (Oral)   Resp 16   Ht 5\' 2"  (1.575 m)   Wt 220 lb 0.3 oz (99.8 kg)   LMP 04/25/2023 (Exact Date)   SpO2 97%   BMI 40.24 kg/m   Visual Acuity Right Eye Distance:   Left Eye Distance:   Bilateral Distance:    Right Eye Near:   Left Eye Near:    Bilateral Near:     Physical Exam Vitals and nursing note reviewed.  Constitutional:      General: She is not in acute distress.    Appearance: She is well-developed and well-groomed.  HENT:     Head: Normocephalic and atraumatic.  Eyes:     Conjunctiva/sclera: Conjunctivae normal.  Cardiovascular:     Rate and Rhythm: Normal rate and regular rhythm.     Heart sounds: Normal heart sounds. No murmur heard. Pulmonary:     Effort: Pulmonary effort is normal. No respiratory distress.     Breath sounds: Normal breath sounds and air entry.  Abdominal:     General: Bowel sounds are normal.     Palpations: Abdomen is soft.     Tenderness: There is no abdominal tenderness.  Musculoskeletal:        General: No swelling.     Cervical back: Neck supple.  Skin:    General: Skin is warm and dry.     Capillary Refill: Capillary refill takes less than 2 seconds.  Neurological:     General: No focal deficit present.     Mental Status: She is alert and oriented to person, place, and time.     GCS: GCS eye subscore is 4. GCS verbal subscore is 5. GCS motor subscore is 6.  Psychiatric:        Mood and Affect: Mood normal.        Behavior: Behavior is cooperative.      UC Treatments / Results  Labs (all labs ordered are listed,  but only abnormal results are displayed) Labs Reviewed  URINALYSIS, W/ REFLEX TO CULTURE (INFECTION SUSPECTED) - Abnormal; Notable for the following components:      Result Value   Bacteria, UA FEW (*)    All other  components within normal limits  PREGNANCY, URINE    EKG   Radiology No results found.  Procedures Procedures (including critical care time)  Medications Ordered in UC Medications - No data to display  Initial Impression / Assessment and Plan / UC Course  I have reviewed the triage vital signs and the nursing notes.  Pertinent labs & imaging results that were available during my care of the patient were reviewed by me and considered in my medical decision making (see chart for details).  Clinical Course as of 06/04/23 2029  Fri Jun 04, 2023  1310 Urine preg and ua ordered for irregular period and lower abdominal pain [JD]  2029 Patient offered Aptima swab,  patient declined states she has an appointment with her PCP tomorrow [JD]    Clinical Course User Index [JD] Tine Mabee, Eveleen Hinds, NP   Discussed exam findings and plan of care with patient, refilled albuterol  for asthma , strict go to ER precautions given.   Patient verbalized understanding to this provider.  Ddx: Lower abdominal pain, missed period, history of asthma, encounter for medication refill, allergies Final Clinical Impressions(s) / UC Diagnoses   Final diagnoses:  Lower abdominal pain  Missed period  Encounter for medication refill  History of asthma     Discharge Instructions      Please keep your follow up appt with your PCP tomorrow as scheduled Your urine was negative for uti and pregnancy If you develop worsening abdominal pain,fever,nausea/vomiting, etc go to ER for further evaluation We have refilled your albuterol  and allergy med,take as prescribed.     ED Prescriptions     Medication Sig Dispense Auth. Provider   albuterol  (VENTOLIN  HFA) 108 (90 Base) MCG/ACT inhaler  (Status: Discontinued) Inhale 2 puffs into the lungs every 6 (six) hours as needed for wheezing or shortness of breath. 18 g Alyse Kathan, NP   cetirizine  (ZYRTEC  ALLERGY) 10 MG tablet Take 1 tablet (10 mg total) by  mouth daily for 14 days. 14 tablet Jayten Gabbard, NP   albuterol  (VENTOLIN  HFA) 108 (90 Base) MCG/ACT inhaler Inhale 2 puffs into the lungs every 6 (six) hours as needed for wheezing or shortness of breath. 18 g Kambrie Eddleman, Eveleen Hinds, NP      PDMP not reviewed this encounter.   Peter Brands, NP 06/04/23 2029

## 2023-06-04 NOTE — Discharge Instructions (Addendum)
 Please keep your follow up appt with your PCP tomorrow as scheduled Your urine was negative for uti and pregnancy If you develop worsening abdominal pain,fever,nausea/vomiting, etc go to ER for further evaluation We have refilled your albuterol  and allergy med,take as prescribed.

## 2023-07-19 ENCOUNTER — Ambulatory Visit: Admission: EM | Admit: 2023-07-19 | Discharge: 2023-07-19 | Disposition: A | Discharge status: Home / Self Care

## 2023-07-19 DIAGNOSIS — T63461A Toxic effect of venom of wasps, accidental (unintentional), initial encounter: Secondary | ICD-10-CM | POA: Diagnosis not present

## 2023-07-19 DIAGNOSIS — Z8709 Personal history of other diseases of the respiratory system: Secondary | ICD-10-CM | POA: Diagnosis not present

## 2023-07-19 DIAGNOSIS — Z760 Encounter for issue of repeat prescription: Secondary | ICD-10-CM

## 2023-07-19 DIAGNOSIS — L989 Disorder of the skin and subcutaneous tissue, unspecified: Secondary | ICD-10-CM

## 2023-07-19 HISTORY — DX: Hidradenitis suppurativa: L73.2

## 2023-07-19 MED ORDER — ALBUTEROL SULFATE HFA 108 (90 BASE) MCG/ACT IN AERS: 2.0000 | INHALATION_SPRAY | Freq: Four times a day (QID) | RESPIRATORY_TRACT | 0 refills | Status: DC | PRN

## 2023-07-19 MED ORDER — CETIRIZINE HCL 10 MG PO TABS: 10.0000 mg | ORAL_TABLET | Freq: Every day | ORAL | 0 refills | Status: AC

## 2023-07-19 NOTE — ED Triage Notes (Addendum)
 Patient to Urgent Care with complaints of a wasp sting on her scalp.   Symptoms started yesterday. Reports this morning the top of her scalp is very tender and swollen. Uncomfortable to move her head and turn her neck.   Has been icing the area.  (Requests refill of her albuterol  inhaler)

## 2023-07-19 NOTE — ED Provider Notes (Signed)
 [facility]    CSN: [id number] Arrival date & time: 07/19/23  0908      History   Chief Complaint Chief Complaint  Patient presents with   Insect Bite    HPI Angela Middleton is a 23 y.o. female.  Patient presents with a painful swollen wasp sting on her scalp that happened yesterday.  She states the side of her head feels swollen and tender.  No open wounds today.  No redness or fever.  No drainage.  She has been treating it with ice packs.  She denies difficulty swallowing or breathing.  Patient also presents with request for refill of her albuterol  inhaler.  She has history of asthma.  She denies wheezing or shortness of breath.  The history is provided by the patient and medical records.    Past Medical History:  Diagnosis Date   Asthma    Hidradenitis suppurativa     Patient Active Problem List   Diagnosis Date Noted   Lower abdominal pain 06/04/2023   Missed period 06/04/2023   Encounter for medication refill 06/04/2023   History of asthma 06/04/2023   Pain of joint of left ankle and foot 12/16/2020   Salter-Harris Type I fracture of lower end of fibula 07/07/2012    Past Surgical History:  Procedure Laterality Date   NO PAST SURGERIES      OB History     Gravida  2   Para      Term      Preterm      AB  1   Living         SAB  1   IAB      Ectopic      Multiple      Live Births               Home Medications    Prior to Admission medications   Medication Sig Start Date End Date Taking? Authorizing Provider  cetirizine  (ZYRTEC  ALLERGY) 10 MG tablet Take 1 tablet (10 mg total) by mouth daily for 14 days. 07/19/23 08/02/23 Yes Wellington Half, NP  spironolactone (ALDACTONE) 50 MG tablet Take 50 mg by mouth daily. 06/29/23  Yes [provider]  albuterol  (VENTOLIN  HFA) 108 (90 Base) MCG/ACT inhaler Inhale 2 puffs into the lungs every 6 (six) hours as needed for wheezing or shortness of breath. 07/19/23   Wellington Half, NP   benzonatate  (TESSALON  PERLES) 100 MG capsule Take 1 capsule (100 mg total) by mouth 3 (three) times daily as needed. 04/04/23   Marciana Settle, PA-C  fluticasone  (FLONASE ) 50 MCG/ACT nasal spray Place 2 sprays into both nostrils daily. Patient not taking: Reported on 07/19/2023 12/06/22   Yevette Hem, FNP  fluticasone  (FLOVENT  HFA) 110 MCG/ACT inhaler Inhale 2 puffs into the lungs daily. 05/10/23 06/09/23  Blair, Diane W, FNP  methylPREDNISolone  (MEDROL  DOSEPAK) 4 MG TBPK tablet Take as directed. 04/04/23   Marciana Settle, PA-C  montelukast (SINGULAIR) 10 MG tablet Take 10 mg by mouth daily.    [provider]  sodium chloride (OCEAN) 0.65 % SOLN nasal spray Place 1 spray into both nostrils as needed for up to 5 days for congestion. 04/04/23 04/09/23  Marciana Settle, PA-C  SPRINTEC 28 0.25-35 MG-MCG tablet Take 1 tablet by mouth daily. 10/05/19 03/06/20  [provider]    Family History Family History  Problem Relation Age of Onset   Asthma Mother    Healthy Mother  Diabetes Father    Healthy Father    Hypertension Father    High Cholesterol Father    Diabetes Paternal Grandfather    Hypertension Paternal Grandfather    Diabetes Paternal Grandmother    Hypertension Paternal Grandmother    Diabetes Maternal Uncle    Hypertension Maternal Uncle     Social History Social History   Tobacco Use   Smoking status: Never   Smokeless tobacco: Never  Vaping Use   Vaping status: Some Days   Substances: Nicotine, Flavoring  Substance Use Topics   Alcohol use: Yes    Comment: socially   Drug use: Yes    Types: Marijuana    Comment: 4-5 times weekly     Allergies   Shrimp [shellfish allergy]   Review of Systems Review of Systems  Constitutional:  Negative for chills and fever.  HENT:  Negative for sore throat, trouble swallowing and voice change.   Respiratory:  Negative for cough, shortness of breath and wheezing.   Skin:  Positive for wound. Negative for  color change.     Physical Exam Triage Vital Signs ED Triage Vitals  Encounter Vitals Group     BP      Systolic BP Percentile      Diastolic BP Percentile      Pulse      Resp      Temp      Temp src      SpO2      Weight      Height      Head Circumference      Peak Flow      Pain Score      Pain Loc      Pain Education      Exclude from Growth Chart    No data found.  Updated Vital Signs BP 110/77   Pulse 90   Temp 98 F (36.7 C)   Resp 17   LMP 07/12/2023   SpO2 98%   Visual Acuity Right Eye Distance:   Left Eye Distance:   Bilateral Distance:    Right Eye Near:   Left Eye Near:    Bilateral Near:     Physical Exam Constitutional:      General: She is not in acute distress. HENT:     Mouth/Throat:     Mouth: Mucous membranes are moist.     Pharynx: Oropharynx is clear.  Cardiovascular:     Rate and Rhythm: Normal rate and regular rhythm.     Heart sounds: Normal heart sounds.  Pulmonary:     Effort: Pulmonary effort is normal. No respiratory distress.     Breath sounds: Normal breath sounds. No wheezing.  Skin:    General: Skin is warm and dry.     Findings: No erythema.     Comments: 4 mm flesh-colored area of induration on right top of scalp. No open wound, drainage, or erythema.   Neurological:     Mental Status: She is alert.      UC Treatments / Results  Labs (all labs ordered are listed, but only abnormal results are displayed) Labs Reviewed - No data to display  EKG   Radiology No results found.  Procedures Procedures (including critical care time)  Medications Ordered in UC Medications - No data to display  Initial Impression / Assessment and Plan / UC Course  I have reviewed the triage vital signs and the nursing notes.  Pertinent labs & imaging results that  were available during my care of the patient were reviewed by me and considered in my medical decision making (see chart for details).    Sore on scalp, wasp  sting, history of asthma, medication refill.  Patient has a small flesh-colored bump where a wasp stung her yesterday.  No indication of infection at this time.  Treating with Zyrtec .  Patient also request a refill of her albuterol  inhaler.  Refill sent to pharmacy and instructed patient to follow-up with her PCP for additional refills.  She agrees to plan of care.  Final Clinical Impressions(s) / UC Diagnoses   Final diagnoses:  Sore on scalp  Wasp sting, accidental or unintentional, initial encounter  History of asthma  Medication refill     Discharge Instructions      Take the Zyrtec  as directed.  Use the albuterol  inhaler as directed.  Follow-up with your primary care provider.   ED Prescriptions     Medication Sig Dispense Auth. Provider   albuterol  (VENTOLIN  HFA) 108 (90 Base) MCG/ACT inhaler Inhale 2 puffs into the lungs every 6 (six) hours as needed for wheezing or shortness of breath. 18 g Wellington Half, NP   cetirizine  (ZYRTEC  ALLERGY) 10 MG tablet Take 1 tablet (10 mg total) by mouth daily for 14 days. 14 tablet Wellington Half, NP      PDMP not reviewed this encounter.   Wellington Half, NP 07/19/23 [phone]

## 2023-07-19 NOTE — Discharge Instructions (Addendum)
 Take the Zyrtec  as directed.  Use the albuterol  inhaler as directed.  Follow-up with your primary care provider.

## 2023-08-20 ENCOUNTER — Telehealth: Admitting: Physician Assistant

## 2023-08-20 DIAGNOSIS — J028 Acute pharyngitis due to other specified organisms: Secondary | ICD-10-CM

## 2023-08-20 DIAGNOSIS — B9689 Other specified bacterial agents as the cause of diseases classified elsewhere: Secondary | ICD-10-CM

## 2023-08-20 MED ORDER — LIDOCAINE VISCOUS HCL 2 % MT SOLN
OROMUCOSAL | 0 refills | Status: AC
Start: 2023-08-20 — End: ?

## 2023-08-20 MED ORDER — AMOXICILLIN 500 MG PO CAPS: 500.0000 mg | ORAL_CAPSULE | Freq: Two times a day (BID) | ORAL | 0 refills | Status: AC

## 2023-08-20 NOTE — Patient Instructions (Signed)
 Angela Middleton, thank you for joining Angela CHRISTELLA Dickinson, PA-C for today's virtual visit.  While this provider is not your primary care provider (PCP), if your PCP is located in our provider database this encounter information will be shared with them immediately following your visit.   A [facility] MyChart account gives you access to today's visit and all your visits, tests, and labs performed at [facility]  click here if you don't have a [facility] MyChart account or go to [email or web address]  Consent: (Patient) Angela Middleton provided verbal consent for this virtual visit at the beginning of the encounter.  Current Medications:  Current Outpatient Medications:    amoxicillin  (AMOXIL ) 500 MG capsule, Take 1 capsule (500 mg total) by mouth 2 (two) times daily for 10 days., Disp: 20 capsule, Rfl: 0   lidocaine  (XYLOCAINE ) 2 % solution, Swallow 5mL every 4-6 hours as needed for sore throat, Disp: 100 mL, Rfl: 0   albuterol  (VENTOLIN  HFA) 108 (90 Base) MCG/ACT inhaler, Inhale 2 puffs into the lungs every 6 (six) hours as needed for wheezing or shortness of breath., Disp: 18 g, Rfl: 0   benzonatate  (TESSALON  PERLES) 100 MG capsule, Take 1 capsule (100 mg total) by mouth 3 (three) times daily as needed., Disp: 20 capsule, Rfl: 0   cetirizine  (ZYRTEC  ALLERGY) 10 MG tablet, Take 1 tablet (10 mg total) by mouth daily for 14 days., Disp: 14 tablet, Rfl: 0   fluticasone  (FLONASE ) 50 MCG/ACT nasal spray, Place 2 sprays into both nostrils daily. (Patient not taking: Reported on 07/19/2023), Disp: 16 g, Rfl: 6   fluticasone  (FLOVENT  HFA) 110 MCG/ACT inhaler, Inhale 2 puffs into the lungs daily., Disp: 1 each, Rfl: 0   methylPREDNISolone  (MEDROL  DOSEPAK) 4 MG TBPK tablet, Take as directed., Disp: 1 each, Rfl: 0   montelukast (SINGULAIR) 10 MG tablet, Take 10 mg by mouth daily., Disp: , Rfl:    sodium chloride (OCEAN) 0.65 % SOLN nasal spray, Place 1 spray into both nostrils as needed  for up to 5 days for congestion., Disp: 15 mL, Rfl: 0   spironolactone (ALDACTONE) 50 MG tablet, Take 50 mg by mouth daily., Disp: , Rfl:    Medications ordered in this encounter:  Meds ordered this encounter  Medications   lidocaine  (XYLOCAINE ) 2 % solution    Sig: Swallow 5mL every 4-6 hours as needed for sore throat    Dispense:  100 mL    Refill:  0    Supervising Provider:   BLAISE ALEENE KIDD [[id number]]   amoxicillin  (AMOXIL ) 500 MG capsule    Sig: Take 1 capsule (500 mg total) by mouth 2 (two) times daily for 10 days.    Dispense:  20 capsule    Refill:  0    Supervising Provider:   BLAISE ALEENE KIDD [[id number]]     *If you need refills on other medications prior to your next appointment, please contact your pharmacy*  Follow-Up: Call back or seek an in-person evaluation if the symptoms worsen or if the condition fails to improve as anticipated.  [facility] [phone]  Other Instructions Pharyngitis  Pharyngitis is inflammation of the throat (pharynx). It is a very common cause of sore throat. Pharyngitis can be caused by a bacteria, but it is usually caused by a virus. Most cases of pharyngitis get better on their own without treatment. What are the causes? This condition may be caused by: Infection by viruses (viral). Viral pharyngitis spreads easily from person to  person (is contagious) through coughing, sneezing, and sharing of personal items or utensils such as cups, forks, spoons, and toothbrushes. Infection by bacteria (bacterial). Bacterial pharyngitis may be spread by touching the nose or face after coming in contact with the bacteria, or through close contact, such as kissing. Allergies. Allergies can cause buildup of mucus in the throat (post-nasal drip), leading to inflammation and irritation. Allergies can also cause blocked nasal passages, forcing breathing through the mouth, which dries and irritates the throat. What increases the risk? You are  more likely to develop this condition if: You are 42-75 years old. You are exposed to crowded environments such as daycare, school, or dormitory living. You live in a cold climate. You have a weakened disease-fighting (immune) system. What are the signs or symptoms? Symptoms of this condition vary by the cause. Common symptoms of this condition include: Sore throat. Fatigue. Low-grade fever. Stuffy nose (nasal congestion) and cough. Headache. Other symptoms may include: Glands in the neck (lymph nodes) that are swollen. Skin rashes. Plaque-like film on the throat or tonsils. This is often a symptom of bacterial pharyngitis. Vomiting. Red, itchy eyes (conjunctivitis). Loss of appetite. Joint pain and muscle aches. Enlarged tonsils. How is this diagnosed? This condition may be diagnosed based on your medical history and a physical exam. Your health care provider will ask you questions about your illness and your symptoms. A swab of your throat may be done to check for bacteria (rapid strep test). Other lab tests may also be done, depending on the suspected cause, but these are rare. How is this treated? Many times, treatment is not needed for this condition. Pharyngitis usually gets better in 3-4 days without treatment. Bacterial pharyngitis may be treated with antibiotic medicines. Follow these instructions at home: Medicines Take over-the-counter and prescription medicines only as told by your health care provider. If you were prescribed an antibiotic medicine, take it as told by your health care provider. Do not stop taking the antibiotic even if you start to feel better. Use throat sprays to soothe your throat as told by your health care provider. Children can get pharyngitis. Do not give your child aspirin because of the association with Reye's syndrome. Managing pain To help with pain, try: Sipping warm liquids, such as broth, herbal tea, or warm water. Eating or drinking cold  or frozen liquids, such as frozen ice pops. Gargling with a mixture of salt and water 3-4 times a day or as needed. To make salt water, completely dissolve -1 tsp (3-6 g) of salt in 1 cup (237 mL) of warm water. Sucking on hard candy or throat lozenges. Putting a cool-mist humidifier in your bedroom at night to moisten the air. Sitting in the bathroom with the door closed for 5-10 minutes while you run hot water in the shower.  General instructions  Do not use any products that contain nicotine or tobacco. These products include cigarettes, chewing tobacco, and vaping devices, such as e-cigarettes. If you need help quitting, ask your health care provider. Rest as told by your health care provider. Drink enough fluid to keep your urine pale yellow. How is this prevented? To help prevent becoming infected or spreading infection: Wash your hands often with soap and water for at least 20 seconds. If soap and water are not available, use hand sanitizer. Do not touch your eyes, nose, or mouth with unwashed hands, and wash hands after touching these areas. Do not share cups or eating utensils. Avoid close contact with  people who are sick. Contact a health care provider if: You have large, tender lumps in your neck. You have a rash. You cough up green, yellow-brown, or bloody mucus. Get help right away if: Your neck becomes stiff. You drool or are unable to swallow liquids. You cannot drink or take medicines without vomiting. You have severe pain that does not go away, even after you take medicine. You have trouble breathing, and it is not caused by a stuffy nose. You have new pain and swelling in your joints such as the knees, ankles, wrists, or elbows. These symptoms may represent a serious problem that is an emergency. Do not wait to see if the symptoms will go away. Get medical help right away. Call your local emergency services (911 in the [place] Do not drive yourself to the  hospital. Summary Pharyngitis is redness, pain, and swelling (inflammation) of the throat (pharynx). While pharyngitis can be caused by a bacteria, the most common causes are viral. Most cases of pharyngitis get better on their own without treatment. Bacterial pharyngitis is treated with antibiotic medicines. This information is not intended to replace advice given to you by your health care provider. Make sure you discuss any questions you have with your health care provider. Document Revised: 04/24/2020 Document Reviewed: 04/24/2020 Elsevier Patient Education  2024 [employer]   If you have been instructed to have an in-person evaluation today at a local Urgent Care facility, please use the link below. It will take you to a list of all of our available [facility], including address, phone number and hours of operation. Please do not delay care.  [facility]  If you or a family member do not have a primary care provider, use the link below to schedule a visit and establish care. When you choose a [facility] primary care physician or advanced practice provider, you gain a long-term partner in health. Find a Primary Care Provider  Learn more about [facility]'s in-office and virtual care options: [facility] - Get Care Now

## 2023-08-20 NOTE — Progress Notes (Signed)
 Virtual Visit Consent   Angela Middleton, you are scheduled for a virtual visit with a [facility] provider today. Just as with appointments in the office, your consent must be obtained to participate. Your consent will be active for this visit and any virtual visit you may have with one of our providers in the next 365 days. If you have a MyChart account, a copy of this consent can be sent to you electronically.  As this is a virtual visit, video technology does not allow for your provider to perform a traditional examination. This may limit your provider's ability to fully assess your condition. If your provider identifies any concerns that need to be evaluated in person or the need to arrange testing (such as labs, EKG, etc.), we will make arrangements to do so. Although advances in technology are sophisticated, we cannot ensure that it will always work on either your end or our end. If the connection with a video visit is poor, the visit may have to be switched to a telephone visit. With either a video or telephone visit, we are not always able to ensure that we have a secure connection.  By engaging in this virtual visit, you consent to the provision of healthcare and authorize for your insurance to be billed (if applicable) for the services provided during this visit. Depending on your insurance coverage, you may receive a charge related to this service.  I need to obtain your verbal consent now. Are you willing to proceed with your visit today? [name] has provided verbal consent on 08/20/2023 for a virtual visit (video or telephone). [name], PA-C  Date: 08/20/2023 4:10 PM   Virtual Visit via Video Note   I, [name], connected with  [name]  ([id number], 2000/10/04) on 08/20/23 at  4:00 PM EDT by a video-enabled telemedicine application and verified that I am speaking with the correct person using two identifiers.  Location: Patient: Virtual Visit  Location Patient: Home Provider: Virtual Visit Location Provider: Home Office   I discussed the limitations of evaluation and management by telemedicine and the availability of in person appointments. The patient expressed understanding and agreed to proceed.    History of Present Illness: Angela Middleton is a 23 y.o. who identifies as a female who was assigned female at birth, and is being seen today for sore throat.  HPI: URI  This is a new problem. The current episode started in the past 7 days (Sunday, 08/15/23). The problem has been gradually worsening. Maximum temperature: hot and cold chills. Associated symptoms include congestion, coughing (very mild), diarrhea (started last night), ear pain (left ear just today), headaches, a sore throat and swollen glands. Pertinent negatives include no chest pain, nausea, plugged ear sensation, rhinorrhea, sinus pain, vomiting or wheezing. Treatments tried: nyquil, dayquil, cough drops. The treatment provided no relief.  Covid testing from health department was negative    Problems:  Patient Active Problem List   Diagnosis Date Noted   Lower abdominal pain 06/04/2023   Missed period 06/04/2023   Encounter for medication refill 06/04/2023   History of asthma 06/04/2023   Pain of joint of left ankle and foot 12/16/2020   Salter-Harris Type I fracture of lower end of fibula 07/07/2012    Allergies:  Allergies  Allergen Reactions   Shrimp [Shellfish Allergy] Nausea And Vomiting   Medications:  Current Outpatient Medications:    amoxicillin  (AMOXIL ) 500 MG capsule, Take 1 capsule (500 mg total) by mouth 2 (  two) times daily for 10 days., Disp: 20 capsule, Rfl: 0   lidocaine  (XYLOCAINE ) 2 % solution, Swallow 5mL every 4-6 hours as needed for sore throat, Disp: 100 mL, Rfl: 0   albuterol  (VENTOLIN  HFA) 108 (90 Base) MCG/ACT inhaler, Inhale 2 puffs into the lungs every 6 (six) hours as needed for wheezing or shortness of breath., Disp: 18 g, Rfl:  0   benzonatate  (TESSALON  PERLES) 100 MG capsule, Take 1 capsule (100 mg total) by mouth 3 (three) times daily as needed., Disp: 20 capsule, Rfl: 0   cetirizine  (ZYRTEC  ALLERGY) 10 MG tablet, Take 1 tablet (10 mg total) by mouth daily for 14 days., Disp: 14 tablet, Rfl: 0   fluticasone  (FLONASE ) 50 MCG/ACT nasal spray, Place 2 sprays into both nostrils daily. (Patient not taking: Reported on 07/19/2023), Disp: 16 g, Rfl: 6   fluticasone  (FLOVENT  HFA) 110 MCG/ACT inhaler, Inhale 2 puffs into the lungs daily., Disp: 1 each, Rfl: 0   methylPREDNISolone  (MEDROL  DOSEPAK) 4 MG TBPK tablet, Take as directed., Disp: 1 each, Rfl: 0   montelukast (SINGULAIR) 10 MG tablet, Take 10 mg by mouth daily., Disp: , Rfl:    sodium chloride (OCEAN) 0.65 % SOLN nasal spray, Place 1 spray into both nostrils as needed for up to 5 days for congestion., Disp: 15 mL, Rfl: 0   spironolactone (ALDACTONE) 50 MG tablet, Take 50 mg by mouth daily., Disp: , Rfl:   Observations/Objective: Patient is well-developed, well-nourished in no acute distress.  Resting comfortably at home.  Head is normocephalic, atraumatic.  No labored breathing.  Speech is clear and coherent with logical content.  Patient is alert and oriented at baseline.    Assessment and Plan: 1. Acute bacterial pharyngitis (Primary) - lidocaine  (XYLOCAINE ) 2 % solution; Swallow 5mL every 4-6 hours as needed for sore throat  Dispense: 100 mL; Refill: 0 - amoxicillin  (AMOXIL ) 500 MG capsule; Take 1 capsule (500 mg total) by mouth 2 (two) times daily for 10 days.  Dispense: 20 capsule; Refill: 0  - Suspect bacterial pharyngitis - Amoxicillin  and Viscous Lidocaine  prescribed - Tylenol (pain reliever/fever reducer) and Ibuprofen  (inflammation) alternating every 4 hours - Salt water gargles - Chloraseptic spray - Liquid and soft food diet - Push fluids - New toothbrush in 3 days - Seek in person evaluation if not improving or if symptoms worsen   Follow Up  Instructions: I discussed the assessment and treatment plan with the patient. The patient was provided an opportunity to ask questions and all were answered. The patient agreed with the plan and demonstrated an understanding of the instructions.  A copy of instructions were sent to the patient via MyChart unless otherwise noted below.    The patient was advised to call back or seek an in-person evaluation if the symptoms worsen or if the condition fails to improve as anticipated.    [name], PA-C

## 2023-08-23 ENCOUNTER — Ambulatory Visit: Admitting: Student in an Organized Health Care Education/Training Program

## 2023-09-02 ENCOUNTER — Ambulatory Visit
Admission: RE | Admit: 2023-09-02 | Discharge: 2023-09-02 | Disposition: A | Discharge status: Home / Self Care | Source: Ambulatory Visit | Attending: Emergency Medicine | Admitting: Emergency Medicine

## 2023-09-02 VITALS — BP 124/79 | HR 98 | Temp 99.5°F | Resp 18

## 2023-09-02 DIAGNOSIS — H6503 Acute serous otitis media, bilateral: Secondary | ICD-10-CM | POA: Diagnosis not present

## 2023-09-02 MED ORDER — PREDNISONE 10 MG (21) PO TBPK: ORAL_TABLET | Freq: Every day | ORAL | 0 refills | Status: DC

## 2023-09-02 MED ORDER — ALBUTEROL SULFATE HFA 108 (90 BASE) MCG/ACT IN AERS: 2.0000 | INHALATION_SPRAY | Freq: Four times a day (QID) | RESPIRATORY_TRACT | 0 refills | Status: DC | PRN

## 2023-09-02 MED ORDER — CEFDINIR 300 MG PO CAPS
300.0000 mg | ORAL_CAPSULE | Freq: Two times a day (BID) | ORAL | 0 refills | Status: AC
Start: 2023-09-02 — End: 2023-09-12

## 2023-09-02 MED ORDER — FLUCONAZOLE 150 MG PO TABS: 150.0000 mg | ORAL_TABLET | ORAL | 0 refills | Status: DC | PRN

## 2023-09-02 NOTE — ED Triage Notes (Addendum)
 Patient reports that she is having difficulty hearing off and on in both ears x 3 weeks. Denies pain at this time, however states the first week she had pain left ear. Used peroxide in both ear 1 st week. Patient has discomfort in the roof of mouth and cough with green sputum x 3 weeks. Did a Covid test at home 3 weeks ago and results was negative.

## 2023-09-02 NOTE — Discharge Instructions (Signed)
 Today you are being treated for an infection of the eardrum  Take cefdinir  twice daily for 10 days, you should begin to see improvement after 48 hours of medication use and then it should progressively get better if you begin to have yeast symptoms such as vaginal discharge, itching you may start use of Diflucan   Take prednisone  every morning with food to reduce ear pain and to help with shortness of breath, this medicine reduces inflammation and irritation throughout the body  Inhaler has been refilled  You may use Tylenol  for management of discomfort  May hold warm compresses to the ear for additional comfort  Please not attempted any ear cleaning or object or fluid placement into the ear canal to prevent further irritation   For cough: honey 1/2 to 1 teaspoon (you can dilute the honey in water or another fluid).  You can also use guaifenesin and dextromethorphan for cough. You can use a humidifier for chest congestion and cough.  If you don't have a humidifier, you can sit in the bathroom with the hot shower running.      For sore throat: try warm salt water gargles, cepacol lozenges, throat spray, warm tea or water with lemon/honey, popsicles or ice, or OTC cold relief medicine for throat discomfort.   For congestion: take a daily anti-histamine like Zyrtec , Claritin, and a oral decongestant, such as pseudoephedrine.  You can also use Flonase  1-2 sprays in each nostril daily.   It is important to stay hydrated: drink plenty of fluids (water, gatorade/powerade/pedialyte, juices, or teas) to keep your throat moisturized and help further relieve irritation/discomfort.

## 2023-09-02 NOTE — ED Provider Notes (Signed)
 [name]    CSN: [id number] Arrival date & time: 09/02/23  1211      History   Chief Complaint Chief Complaint  Patient presents with   Ear Drainage    Think I have easr infection left side - Entered by patient    HPI Angela Middleton is a 23 y.o. female.   Patient presents for evaluation of chills, intermittent headaches, nasal congestion, mild nonproductive cough, sore throat with a red spot present to the roof of the mouth present for 3 weeks.  Congestion and sinus pressure have improved.  Completed an e-visit and was prescribed amoxicillin  which did provide some relief.  Over the past week has begun to experience worsening bilateral ear fullness, initially experiencing left-sided ear pain which has resolved.  Hearing is decreased.  Recent travel via plane and known sick contact with the same symptoms.  Home COVID test negative.  Tolerable to food and liquids.  Has attempted use of peroxide and Mucinex AM and PM.  History of asthma has been using albuterol  inhaler, denies wheezing, experiences shortness of breath at baseline which has not worsened.  Past Medical History:  Diagnosis Date   Asthma    Hidradenitis suppurativa     Patient Active Problem List   Diagnosis Date Noted   Lower abdominal pain 06/04/2023   Missed period 06/04/2023   Encounter for medication refill 06/04/2023   History of asthma 06/04/2023   Pain of joint of left ankle and foot 12/16/2020   Salter-Harris Type I fracture of lower end of fibula 07/07/2012    Past Surgical History:  Procedure Laterality Date   NO PAST SURGERIES      OB History     Gravida  2   Para      Term      Preterm      AB  1   Living         SAB  1   IAB      Ectopic      Multiple      Live Births               Home Medications    Prior to Admission medications   Medication Sig Start Date End Date Taking? Authorizing Provider  cefdinir  (OMNICEF ) 300 MG capsule Take 1 capsule (300  mg total) by mouth 2 (two) times daily for 10 days. 09/02/23 09/12/23 Yes Telisha Zawadzki R, NP  fluconazole  (DIFLUCAN ) 150 MG tablet Take 1 tablet (150 mg total) by mouth every three (3) days as needed for up to 2 doses. 09/02/23  Yes Laikyn Gewirtz R, NP  predniSONE  (STERAPRED UNI-PAK 21 TAB) 10 MG (21) TBPK tablet Take by mouth daily. Take 6 tabs by mouth daily  for 1 days, then 5 tabs for 1 days, then 4 tabs for 1 days, then 3 tabs for 1 days, 2 tabs for 1 days, then 1 tab by mouth daily for 1 days 09/02/23  Yes Giuliano Preece, Shelba SAUNDERS, NP  albuterol  (VENTOLIN  HFA) 108 (90 Base) MCG/ACT inhaler Inhale 2 puffs into the lungs every 6 (six) hours as needed for wheezing or shortness of breath. 09/02/23   Altus Zaino, Shelba SAUNDERS, NP  benzonatate  (TESSALON  PERLES) 100 MG capsule Take 1 capsule (100 mg total) by mouth 3 (three) times daily as needed. 04/04/23   Rolan Berthold, PA-C  cetirizine  (ZYRTEC  ALLERGY) 10 MG tablet Take 1 tablet (10 mg total) by mouth daily for 14 days. 07/19/23 08/02/23  Corlis Sor  H, NP  fluticasone  (FLONASE ) 50 MCG/ACT nasal spray Place 2 sprays into both nostrils daily. Patient not taking: Reported on 07/19/2023 12/06/22   Lavell Bari LABOR, FNP  fluticasone  (FLOVENT  HFA) 110 MCG/ACT inhaler Inhale 2 puffs into the lungs daily. 05/10/23 06/09/23  Blair, Diane W, FNP  lidocaine  (XYLOCAINE ) 2 % solution Swallow 5mL every 4-6 hours as needed for sore throat 08/20/23   Vivienne Delon HERO, PA-C  methylPREDNISolone  (MEDROL  DOSEPAK) 4 MG TBPK tablet Take as directed. 04/04/23   Rolan Berthold, PA-C  montelukast (SINGULAIR) 10 MG tablet Take 10 mg by mouth daily.    [provider]  sodium chloride (OCEAN) 0.65 % SOLN nasal spray Place 1 spray into both nostrils as needed for up to 5 days for congestion. 04/04/23 04/09/23  Rolan Berthold, PA-C  spironolactone (ALDACTONE) 50 MG tablet Take 50 mg by mouth daily. 06/29/23   [provider]  SPRINTEC 28 0.25-35 MG-MCG tablet Take 1 tablet by mouth  daily. 10/05/19 03/06/20  [provider]    Family History Family History  Problem Relation Age of Onset   Asthma Mother    Healthy Mother    Diabetes Father    Healthy Father    Hypertension Father    High Cholesterol Father    Diabetes Paternal Grandfather    Hypertension Paternal Grandfather    Diabetes Paternal Grandmother    Hypertension Paternal Grandmother    Diabetes Maternal Uncle    Hypertension Maternal Uncle     Social History Social History   Tobacco Use   Smoking status: Never   Smokeless tobacco: Never  Vaping Use   Vaping status: Some Days   Substances: Nicotine, Flavoring  Substance Use Topics   Alcohol use: Yes    Comment: socially   Drug use: Yes    Types: Marijuana    Comment: 4-5 times weekly     Allergies   Shrimp [shellfish allergy]   Review of Systems Review of Systems   Physical Exam Triage Vital Signs ED Triage Vitals  Encounter Vitals Group     BP 09/02/23 1239 124/79     Girls Systolic BP Percentile --      Girls Diastolic BP Percentile --      Boys Systolic BP Percentile --      Boys Diastolic BP Percentile --      Pulse Rate 09/02/23 1239 98     Resp 09/02/23 1239 18     Temp 09/02/23 1239 99.5 F (37.5 C)     Temp Source 09/02/23 1239 Oral     SpO2 09/02/23 1239 98 %     Weight --      Height --      Head Circumference --      Peak Flow --      Pain Score 09/02/23 1243 0     Pain Loc --      Pain Education --      Exclude from Growth Chart --    No data found.  Updated Vital Signs BP 124/79 (BP Location: Left Arm)   Pulse 98   Temp 99.5 F (37.5 C) (Oral)   Resp 18   LMP 08/22/2023 (Exact Date)   SpO2 98%   Visual Acuity Right Eye Distance:   Left Eye Distance:   Bilateral Distance:    Right Eye Near:   Left Eye Near:    Bilateral Near:     Physical Exam Constitutional:      Appearance: Normal appearance.  HENT:     Head: Normocephalic.     Right Ear: Ear canal and external ear  normal. Tympanic membrane is erythematous.     Left Ear: Ear canal and external ear normal. Tympanic membrane is erythematous.     Nose: Congestion present.     Mouth/Throat:     Pharynx: No oropharyngeal exudate or posterior oropharyngeal erythema.  Eyes:     Extraocular Movements: Extraocular movements intact.  Cardiovascular:     Rate and Rhythm: Normal rate and regular rhythm.     Pulses: Normal pulses.     Heart sounds: Normal heart sounds.  Pulmonary:     Effort: Pulmonary effort is normal.     Breath sounds: Normal breath sounds.  Musculoskeletal:     Cervical back: Normal range of motion and neck supple.  Neurological:     Mental Status: She is alert and oriented to person, place, and time. Mental status is at baseline.      UC Treatments / Results  Labs (all labs ordered are listed, but only abnormal results are displayed) Labs Reviewed - No data to display  EKG   Radiology No results found.  Procedures Procedures (including critical care time)  Medications Ordered in UC Medications - No data to display  Initial Impression / Assessment and Plan / UC Course  I have reviewed the triage vital signs and the nursing notes.  Pertinent labs & imaging results that were available during my care of the patient were reviewed by me and considered in my medical decision making (see chart for details).  Nonrecurrent acute serous otitis media of both ears  Erythema to the tympanic membrane is consistent with infection, congestion to the nasal turbinates otherwise stable exam, prescribed cefdinir , recent use of penicillin, additionally prescribed prednisone , refilled inhaler and prescribed Diflucan  empirically as she recently used antibiotics. advised against ear cleaning, may use over-the-counter analgesics and warm compresses to the external ear for comfort, may follow-up if symptoms persist worsen or recur  Final Clinical Impressions(s) / UC Diagnoses   Final diagnoses:   Non-recurrent acute serous otitis media of both ears     Discharge Instructions      Today you are being treated for an infection of the eardrum  Take cefdinir  twice daily for 10 days, you should begin to see improvement after 48 hours of medication use and then it should progressively get better if you begin to have yeast symptoms such as vaginal discharge, itching you may start use of Diflucan   Take prednisone  every morning with food to reduce ear pain and to help with shortness of breath, this medicine reduces inflammation and irritation throughout the body  Inhaler has been refilled  You may use Tylenol  for management of discomfort  May hold warm compresses to the ear for additional comfort  Please not attempted any ear cleaning or object or fluid placement into the ear canal to prevent further irritation   For cough: honey 1/2 to 1 teaspoon (you can dilute the honey in water or another fluid).  You can also use guaifenesin and dextromethorphan for cough. You can use a humidifier for chest congestion and cough.  If you don't have a humidifier, you can sit in the bathroom with the hot shower running.      For sore throat: try warm salt water gargles, cepacol lozenges, throat spray, warm tea or water with lemon/honey, popsicles or ice, or OTC cold relief medicine for throat discomfort.   For congestion: take a  daily anti-histamine like Zyrtec , Claritin, and a oral decongestant, such as pseudoephedrine.  You can also use Flonase  1-2 sprays in each nostril daily.   It is important to stay hydrated: drink plenty of fluids (water, gatorade/powerade/pedialyte, juices, or teas) to keep your throat moisturized and help further relieve irritation/discomfort.     ED Prescriptions     Medication Sig Dispense Auth. Provider   cefdinir  (OMNICEF ) 300 MG capsule Take 1 capsule (300 mg total) by mouth 2 (two) times daily for 10 days. 20 capsule Armella Stogner R, NP   predniSONE  (STERAPRED  UNI-PAK 21 TAB) 10 MG (21) TBPK tablet Take by mouth daily. Take 6 tabs by mouth daily  for 1 days, then 5 tabs for 1 days, then 4 tabs for 1 days, then 3 tabs for 1 days, 2 tabs for 1 days, then 1 tab by mouth daily for 1 days 21 tablet Zia Najera R, NP   fluconazole  (DIFLUCAN ) 150 MG tablet Take 1 tablet (150 mg total) by mouth every three (3) days as needed for up to 2 doses. 2 tablet Avarie Tavano R, NP   albuterol  (VENTOLIN  HFA) 108 (90 Base) MCG/ACT inhaler Inhale 2 puffs into the lungs every 6 (six) hours as needed for wheezing or shortness of breath. 18 g Teresa Shelba SAUNDERS, NP      PDMP not reviewed this encounter.   Teresa Shelba SAUNDERS, NP 09/02/23 1406

## 2023-09-07 ENCOUNTER — Telehealth: Payer: Self-pay | Admitting: Pulmonary Disease

## 2023-09-07 NOTE — Telephone Encounter (Signed)
 [facility]MT[facility] to schedule pulmonary consult.

## 2023-09-22 ENCOUNTER — Ambulatory Visit
Admission: RE | Admit: 2023-09-22 | Discharge: 2023-09-22 | Disposition: A | Discharge status: Home / Self Care | Attending: Emergency Medicine | Admitting: Emergency Medicine

## 2023-09-22 VITALS — BP 129/69 | HR 102 | Temp 97.8°F | Resp 18

## 2023-09-22 DIAGNOSIS — N939 Abnormal uterine and vaginal bleeding, unspecified: Secondary | ICD-10-CM | POA: Diagnosis present

## 2023-09-22 DIAGNOSIS — R300 Dysuria: Secondary | ICD-10-CM | POA: Diagnosis present

## 2023-09-22 LAB — POCT URINE DIPSTICK
Bilirubin, UA: NEGATIVE
Glucose, UA: NEGATIVE mg/dL
Ketones, POC UA: NEGATIVE mg/dL
Nitrite, UA: NEGATIVE
Spec Grav, UA: 1.015 (ref 1.010–1.025)
Urobilinogen, UA: 0.2 U/dL
pH, UA: 8.5 — AB (ref 5.0–8.0)

## 2023-09-22 LAB — POCT URINE PREGNANCY: Preg Test, Ur: NEGATIVE

## 2023-09-22 MED ORDER — NITROFURANTOIN MONOHYD MACRO 100 MG PO CAPS
100.0000 mg | ORAL_CAPSULE | Freq: Two times a day (BID) | ORAL | 0 refills | Status: AC
Start: 2023-09-22 — End: ?

## 2023-09-22 NOTE — ED Triage Notes (Signed)
 Patient to Urgent Care with complaints of urinary frequency./ dysuria/ voiding small amounts. Reports when she wipes she sees blood.   Symptoms x1 day. Also reports having a heavy period August 1st-8th which was unusual for her.   Also requests STD testing.

## 2023-09-22 NOTE — Discharge Instructions (Addendum)
 Follow up with your primary care provider tomorrow.  Go to the emergency department if you have worsening symptoms.    Take the antibiotic as directed.  The urine culture is pending.  We will call you if it shows the need to change or discontinue your antibiotic.

## 2023-09-22 NOTE — ED Provider Notes (Signed)
 [name]    CSN: [id number] Arrival date & time: 09/22/23  1129      History   Chief Complaint Chief Complaint  Patient presents with   Urinary Frequency    Been having to use bathroom more with discomfort with bleeding - Entered by patient    HPI Angela Middleton is a 23 y.o. female.  Patient presents with dysuria, urinary frequency, urinary hesitancy, bladder pressure since yesterday.  She also has intermittent vaginal bleeding in the last couple of weeks.  She took Plan B yesterday after having unprotected sex.  She denies fever, chills, abdominal pain, hematuria, pelvic pain, vaginal discharge.  She requests STD testing. Patient states she is leaving for [place] in 2 days and will be there for 2 weeks.   The history is provided by the patient and medical records.    Past Medical History:  Diagnosis Date   Asthma    Hidradenitis suppurativa     Patient Active Problem List   Diagnosis Date Noted   Lower abdominal pain 06/04/2023   Missed period 06/04/2023   Encounter for medication refill 06/04/2023   History of asthma 06/04/2023   Pain of joint of left ankle and foot 12/16/2020   Salter-Harris Type I fracture of lower end of fibula 07/07/2012    Past Surgical History:  Procedure Laterality Date   NO PAST SURGERIES      OB History     Gravida  2   Para      Term      Preterm      AB  1   Living         SAB  1   IAB      Ectopic      Multiple      Live Births               Home Medications    Prior to Admission medications   Medication Sig Start Date End Date Taking? Authorizing Provider  nitrofurantoin , macrocrystal-monohydrate, (MACROBID ) 100 MG capsule Take 1 capsule (100 mg total) by mouth 2 (two) times daily. 09/22/23  Yes Corlis Burnard DEL, NP  albuterol  (VENTOLIN  HFA) 108 (90 Base) MCG/ACT inhaler Inhale 2 puffs into the lungs every 6 (six) hours as needed for wheezing or shortness of breath. 09/02/23   White, Shelba SAUNDERS,  NP  benzonatate  (TESSALON  PERLES) 100 MG capsule Take 1 capsule (100 mg total) by mouth 3 (three) times daily as needed. 04/04/23   Rolan Berthold, PA-C  cetirizine  (ZYRTEC  ALLERGY) 10 MG tablet Take 1 tablet (10 mg total) by mouth daily for 14 days. Patient not taking: Reported on 09/22/2023 07/19/23 08/02/23  Corlis Burnard DEL, NP  fluconazole  (DIFLUCAN ) 150 MG tablet Take 1 tablet (150 mg total) by mouth every three (3) days as needed for up to 2 doses. Patient not taking: Reported on 09/22/2023 09/02/23   Teresa Shelba SAUNDERS, NP  fluticasone  (FLONASE ) 50 MCG/ACT nasal spray Place 2 sprays into both nostrils daily. Patient not taking: Reported on 07/19/2023 12/06/22   Lavell Lye A, FNP  fluticasone  (FLOVENT  HFA) 110 MCG/ACT inhaler Inhale 2 puffs into the lungs daily. 05/10/23 06/09/23  Blair, Diane W, FNP  lidocaine  (XYLOCAINE ) 2 % solution Swallow 5mL every 4-6 hours as needed for sore throat Patient not taking: Reported on 09/22/2023 08/20/23   Burnette, Kristyl M, PA-C  methylPREDNISolone  (MEDROL  DOSEPAK) 4 MG TBPK tablet Take as directed. 04/04/23   Rolan Berthold, PA-C  montelukast (SINGULAIR) 10  MG tablet Take 10 mg by mouth daily.    [provider]  predniSONE  (STERAPRED UNI-PAK 21 TAB) 10 MG (21) TBPK tablet Take by mouth daily. Take 6 tabs by mouth daily  for 1 days, then 5 tabs for 1 days, then 4 tabs for 1 days, then 3 tabs for 1 days, 2 tabs for 1 days, then 1 tab by mouth daily for 1 days Patient not taking: Reported on 09/22/2023 09/02/23   Teresa Shelba SAUNDERS, NP  sodium chloride (OCEAN) 0.65 % SOLN nasal spray Place 1 spray into both nostrils as needed for up to 5 days for congestion. 04/04/23 04/09/23  Rolan Berthold, PA-C  spironolactone (ALDACTONE) 50 MG tablet Take 50 mg by mouth daily. 06/29/23   [provider]  SPRINTEC 28 0.25-35 MG-MCG tablet Take 1 tablet by mouth daily. 10/05/19 03/06/20  [provider]    Family History Family History  Problem Relation Age  of Onset   Asthma Mother    Healthy Mother    Diabetes Father    Healthy Father    Hypertension Father    High Cholesterol Father    Diabetes Paternal Grandfather    Hypertension Paternal Grandfather    Diabetes Paternal Grandmother    Hypertension Paternal Grandmother    Diabetes Maternal Uncle    Hypertension Maternal Uncle     Social History Social History   Tobacco Use   Smoking status: Never   Smokeless tobacco: Never  Vaping Use   Vaping status: Some Days   Substances: Nicotine, Flavoring  Substance Use Topics   Alcohol use: Yes    Comment: socially   Drug use: Yes    Types: Marijuana    Comment: 4-5 times weekly     Allergies   Shrimp [shellfish allergy]   Review of Systems Review of Systems  Constitutional:  Negative for chills and fever.  Gastrointestinal:  Negative for abdominal pain, constipation, diarrhea and vomiting.  Genitourinary:  Positive for dysuria, frequency and vaginal bleeding. Negative for flank pain, hematuria, pelvic pain and vaginal discharge.     Physical Exam Triage Vital Signs ED Triage Vitals  Encounter Vitals Group     BP 09/22/23 1206 129/69     Girls Systolic BP Percentile --      Girls Diastolic BP Percentile --      Boys Systolic BP Percentile --      Boys Diastolic BP Percentile --      Pulse Rate 09/22/23 1206 (!) 102     Resp 09/22/23 1206 18     Temp 09/22/23 1206 97.8 F (36.6 C)     Temp src --      SpO2 09/22/23 1206 100 %     Weight --      Height --      Head Circumference --      Peak Flow --      Pain Score 09/22/23 1219 3     Pain Loc --      Pain Education --      Exclude from Growth Chart --    No data found.  Updated Vital Signs BP 129/69   Pulse (!) 102   Temp 97.8 F (36.6 C)   Resp 18   LMP 09/10/2023 (Exact Date)   SpO2 100%   Visual Acuity Right Eye Distance:   Left Eye Distance:   Bilateral Distance:    Right Eye Near:   Left Eye Near:    Bilateral Near:  Physical  Exam Constitutional:      General: She is not in acute distress. HENT:     Mouth/Throat:     Mouth: Mucous membranes are moist.  Cardiovascular:     Rate and Rhythm: Normal rate and regular rhythm.     Heart sounds: Normal heart sounds.  Pulmonary:     Effort: Pulmonary effort is normal. No respiratory distress.     Breath sounds: Normal breath sounds.  Abdominal:     General: Bowel sounds are normal.     Palpations: Abdomen is soft.     Tenderness: There is no abdominal tenderness. There is no right CVA tenderness, left CVA tenderness, guarding or rebound.  Neurological:     Mental Status: She is alert.      UC Treatments / Results  Labs (all labs ordered are listed, but only abnormal results are displayed) Labs Reviewed  POCT URINE DIPSTICK - Abnormal; Notable for the following components:      Result Value   Blood, UA trace-intact (*)    pH, UA 8.5 (*)    Leukocytes, UA Trace (*)    All other components within normal limits  URINE CULTURE  POCT URINE PREGNANCY  CERVICOVAGINAL ANCILLARY ONLY    EKG   Radiology No results found.  Procedures Procedures (including critical care time)  Medications Ordered in UC Medications - No data to display  Initial Impression / Assessment and Plan / UC Course  I have reviewed the triage vital signs and the nursing notes.  Pertinent labs & imaging results that were available during my care of the patient were reviewed by me and considered in my medical decision making (see chart for details).    Dysuria, abnormal uterine bleeding.  Urine pregnancy negative.  Urine has trace leukocytes but no nitrites.  Urine culture pending.  Patient obtained vaginal self swab for STD testing.  Because she is going out of the country in 2 days and will have limited ability to receive test results, treating dysuria today with Macrobid .  Instructed to abstain from sexual activity until test results are back and treatment is completed.  Instructed  patient to follow-up with her PCP.  ED precautions given.  Education provided on dysuria and abnormal uterine bleeding.  She agrees to plan of care.   Final Clinical Impressions(s) / UC Diagnoses   Final diagnoses:  Dysuria  Abnormal uterine bleeding     Discharge Instructions      Follow up with your primary care provider tomorrow.  Go to the emergency department if you have worsening symptoms.    Take the antibiotic as directed.  The urine culture is pending.  We will call you if it shows the need to change or discontinue your antibiotic.        ED Prescriptions     Medication Sig Dispense Auth. Provider   nitrofurantoin , macrocrystal-monohydrate, (MACROBID ) 100 MG capsule Take 1 capsule (100 mg total) by mouth 2 (two) times daily. 10 capsule Corlis Burnard DEL, NP      PDMP not reviewed this encounter.   Corlis Burnard DEL, NP 09/22/23 1249

## 2023-09-23 LAB — CERVICOVAGINAL ANCILLARY ONLY
Bacterial Vaginitis (gardnerella): NEGATIVE
Candida Glabrata: NEGATIVE
Candida Vaginitis: NEGATIVE
Chlamydia: NEGATIVE
Comment: NEGATIVE
Comment: NEGATIVE
Comment: NEGATIVE
Comment: NEGATIVE
Comment: NEGATIVE
Comment: NORMAL
Neisseria Gonorrhea: NEGATIVE
Trichomonas: NEGATIVE

## 2023-09-24 ENCOUNTER — Ambulatory Visit (HOSPITAL_COMMUNITY): Payer: Self-pay

## 2023-09-24 LAB — URINE CULTURE: Culture: 70000 — AB

## 2023-10-14 ENCOUNTER — Other Ambulatory Visit: Payer: Self-pay | Admitting: Primary Care

## 2023-10-14 DIAGNOSIS — N938 Other specified abnormal uterine and vaginal bleeding: Secondary | ICD-10-CM

## 2023-10-28 ENCOUNTER — Ambulatory Visit: Admitting: Podiatry

## 2023-10-28 DIAGNOSIS — M216X1 Other acquired deformities of right foot: Secondary | ICD-10-CM

## 2023-10-28 DIAGNOSIS — L600 Ingrowing nail: Secondary | ICD-10-CM

## 2023-10-28 DIAGNOSIS — M216X2 Other acquired deformities of left foot: Secondary | ICD-10-CM

## 2023-10-28 NOTE — Progress Notes (Signed)
 Subjective:  Patient ID: Angela Middleton, female    DOB: 05/22/2000,  MRN: [id number]  Chief Complaint  Patient presents with   Ingrown Toenail    Bilateral ingrown nails     23 y.o. female presents with the above complaint.  Patient presents with bilateral hallux medial border ingrown painful to touch has progressed gotten worse worse with ambulation and shoe pressure she would like to discuss treatment options for this.  She has not seen anyone else prior to seeing me.  Pain scale 7 out of 10 dull achy in nature.  She also would like to discuss orthotics option as well.  She has not seen anyone for that as well.   Review of Systems: Negative except as noted in the HPI. Denies N/V/F/Ch.  Past Medical History:  Diagnosis Date   Asthma    Hidradenitis suppurativa     Current Outpatient Medications:    albuterol  (VENTOLIN  HFA) 108 (90 Base) MCG/ACT inhaler, Inhale 2 puffs into the lungs every 6 (six) hours as needed for wheezing or shortness of breath., Disp: 18 g, Rfl: 0   benzonatate  (TESSALON  PERLES) 100 MG capsule, Take 1 capsule (100 mg total) by mouth 3 (three) times daily as needed., Disp: 20 capsule, Rfl: 0   cetirizine  (ZYRTEC  ALLERGY) 10 MG tablet, Take 1 tablet (10 mg total) by mouth daily for 14 days. (Patient not taking: Reported on 09/22/2023), Disp: 14 tablet, Rfl: 0   fluconazole  (DIFLUCAN ) 150 MG tablet, Take 1 tablet (150 mg total) by mouth every three (3) days as needed for up to 2 doses. (Patient not taking: Reported on 09/22/2023), Disp: 2 tablet, Rfl: 0   fluticasone  (FLONASE ) 50 MCG/ACT nasal spray, Place 2 sprays into both nostrils daily. (Patient not taking: Reported on 07/19/2023), Disp: 16 g, Rfl: 6   fluticasone  (FLOVENT  HFA) 110 MCG/ACT inhaler, Inhale 2 puffs into the lungs daily., Disp: 1 each, Rfl: 0   lidocaine  (XYLOCAINE ) 2 % solution, Swallow 5mL every 4-6 hours as needed for sore throat (Patient not taking: Reported on 09/22/2023), Disp: 100 mL, Rfl: 0    methylPREDNISolone  (MEDROL  DOSEPAK) 4 MG TBPK tablet, Take as directed., Disp: 1 each, Rfl: 0   montelukast (SINGULAIR) 10 MG tablet, Take 10 mg by mouth daily., Disp: , Rfl:    nitrofurantoin , macrocrystal-monohydrate, (MACROBID ) 100 MG capsule, Take 1 capsule (100 mg total) by mouth 2 (two) times daily., Disp: 10 capsule, Rfl: 0   predniSONE  (STERAPRED UNI-PAK 21 TAB) 10 MG (21) TBPK tablet, Take by mouth daily. Take 6 tabs by mouth daily  for 1 days, then 5 tabs for 1 days, then 4 tabs for 1 days, then 3 tabs for 1 days, 2 tabs for 1 days, then 1 tab by mouth daily for 1 days (Patient not taking: Reported on 09/22/2023), Disp: 21 tablet, Rfl: 0   sodium chloride (OCEAN) 0.65 % SOLN nasal spray, Place 1 spray into both nostrils as needed for up to 5 days for congestion., Disp: 15 mL, Rfl: 0   spironolactone (ALDACTONE) 50 MG tablet, Take 50 mg by mouth daily., Disp: , Rfl:   Social History   Tobacco Use  Smoking Status Never  Smokeless Tobacco Never    Allergies  Allergen Reactions   Shrimp [Shellfish Allergy] Nausea And Vomiting   Objective:  There were no vitals filed for this visit. There is no height or weight on file to calculate BMI. Constitutional Well developed. Well nourished.  Vascular Dorsalis pedis pulses palpable bilaterally. Posterior tibial  pulses palpable bilaterally. Capillary refill normal to all digits.  No cyanosis or clubbing noted. Pedal hair growth normal.  Neurologic Normal speech. Oriented to person, place, and time. Epicritic sensation to light touch grossly present bilaterally.  Dermatologic Painful ingrowing nail at medial nail borders of the hallux nail bilaterally. No other open wounds. No skin lesions.  Orthopedic: Normal joint ROM without pain or crepitus bilaterally. No visible deformities. No bony tenderness.   Radiographs: None Assessment:   1. Other acquired deformities of left foot   2. Other acquired deformities of right foot   3.  Ingrown toenail of right foot   4. Ingrown left big toenail    Plan:  Patient was evaluated and treated and all questions answered.  Ingrown Nail, bilaterally -Patient elects to proceed with minor surgery to remove ingrown toenail removal today. Consent reviewed and signed by patient. -Ingrown nail excised. See procedure note. -Educated on post-procedure care including soaking. Written instructions provided and reviewed. -Patient to follow up in 2 weeks for nail check.  Procedure: Excision of Ingrown Toenail Location: Bilateral 1st toe medial nail borders. Anesthesia: Lidocaine  1% plain; 1.5 mL and Marcaine 0.5% plain; 1.5 mL, digital block. Skin Prep: Betadine. Dressing: Silvadene; telfa; dry, sterile, compression dressing. Technique: Following skin prep, the toe was exsanguinated and a tourniquet was secured at the base of the toe. The affected nail border was freed, split with a nail splitter, and excised. Chemical matrixectomy was then performed with phenol and irrigated out with alcohol. The tourniquet was then removed and sterile dressing applied. Disposition: Patient tolerated procedure well. Patient to return in 2 weeks for follow-up.   No follow-ups on file.

## 2023-11-11 ENCOUNTER — Ambulatory Visit: Admitting: Podiatry

## 2023-11-11 DIAGNOSIS — L0889 Other specified local infections of the skin and subcutaneous tissue: Secondary | ICD-10-CM

## 2023-11-11 MED ORDER — CLOTRIMAZOLE-BETAMETHASONE 1-0.05 % EX CREA: 1.0000 | TOPICAL_CREAM | Freq: Every day | CUTANEOUS | 0 refills | Status: AC

## 2023-11-11 MED ORDER — TERBINAFINE HCL 250 MG PO TABS: 250.0000 mg | ORAL_TABLET | Freq: Every day | ORAL | 0 refills | Status: AC

## 2023-11-11 MED ORDER — DOXYCYCLINE HYCLATE 100 MG PO TABS: 100.0000 mg | ORAL_TABLET | Freq: Two times a day (BID) | ORAL | 0 refills | Status: AC

## 2023-11-11 NOTE — Progress Notes (Signed)
 Subjective:  Patient ID: Angela Middleton, female    DOB: Mar 21, 2000,  MRN: [id number]  Chief Complaint  Patient presents with   Ingrown Toenail    23 y.o. female presents with the above complaint.  Patient presents with complaint of right fourth interspace superinfection.  She states it just came out of nowhere there is some peeling and itching associated with that she has not seen MRIs prior to seeing me she would like to discuss treatment options for this.  She has tried over-the-counter treatment options pain scale 7 out of 10 dull aching nature   Review of Systems: Negative except as noted in the HPI. Denies N/V/F/Ch.  Past Medical History:  Diagnosis Date   Asthma    Hidradenitis suppurativa     Current Outpatient Medications:    clotrimazole-betamethasone (LOTRISONE) cream, Apply 1 Application topically daily., Disp: 30 g, Rfl: 0   doxycycline  (VIBRA -TABS) 100 MG tablet, Take 1 tablet (100 mg total) by mouth 2 (two) times daily., Disp: 28 tablet, Rfl: 0   terbinafine (LAMISIL) 250 MG tablet, Take 1 tablet (250 mg total) by mouth daily., Disp: 30 tablet, Rfl: 0   albuterol  (VENTOLIN  HFA) 108 (90 Base) MCG/ACT inhaler, Inhale 2 puffs into the lungs every 6 (six) hours as needed for wheezing or shortness of breath., Disp: 18 g, Rfl: 0   benzonatate  (TESSALON  PERLES) 100 MG capsule, Take 1 capsule (100 mg total) by mouth 3 (three) times daily as needed., Disp: 20 capsule, Rfl: 0   cetirizine  (ZYRTEC  ALLERGY) 10 MG tablet, Take 1 tablet (10 mg total) by mouth daily for 14 days. (Patient not taking: Reported on 09/22/2023), Disp: 14 tablet, Rfl: 0   fluconazole  (DIFLUCAN ) 150 MG tablet, Take 1 tablet (150 mg total) by mouth every three (3) days as needed for up to 2 doses. (Patient not taking: Reported on 09/22/2023), Disp: 2 tablet, Rfl: 0   fluticasone  (FLONASE ) 50 MCG/ACT nasal spray, Place 2 sprays into both nostrils daily. (Patient not taking: Reported on 07/19/2023), Disp: 16 g, Rfl:  6   fluticasone  (FLOVENT  HFA) 110 MCG/ACT inhaler, Inhale 2 puffs into the lungs daily., Disp: 1 each, Rfl: 0   lidocaine  (XYLOCAINE ) 2 % solution, Swallow 5mL every 4-6 hours as needed for sore throat (Patient not taking: Reported on 09/22/2023), Disp: 100 mL, Rfl: 0   methylPREDNISolone  (MEDROL  DOSEPAK) 4 MG TBPK tablet, Take as directed., Disp: 1 each, Rfl: 0   montelukast (SINGULAIR) 10 MG tablet, Take 10 mg by mouth daily., Disp: , Rfl:    nitrofurantoin , macrocrystal-monohydrate, (MACROBID ) 100 MG capsule, Take 1 capsule (100 mg total) by mouth 2 (two) times daily., Disp: 10 capsule, Rfl: 0   predniSONE  (STERAPRED UNI-PAK 21 TAB) 10 MG (21) TBPK tablet, Take by mouth daily. Take 6 tabs by mouth daily  for 1 days, then 5 tabs for 1 days, then 4 tabs for 1 days, then 3 tabs for 1 days, 2 tabs for 1 days, then 1 tab by mouth daily for 1 days (Patient not taking: Reported on 09/22/2023), Disp: 21 tablet, Rfl: 0   sodium chloride (OCEAN) 0.65 % SOLN nasal spray, Place 1 spray into both nostrils as needed for up to 5 days for congestion., Disp: 15 mL, Rfl: 0   spironolactone (ALDACTONE) 50 MG tablet, Take 50 mg by mouth daily., Disp: , Rfl:   Social History   Tobacco Use  Smoking Status Never  Smokeless Tobacco Never    Allergies  Allergen Reactions   Shrimp [Shellfish  Allergy] Nausea And Vomiting   Objective:  There were no vitals filed for this visit. There is no height or weight on file to calculate BMI. Constitutional Well developed. Well nourished.  Vascular Dorsalis pedis pulses palpable bilaterally. Posterior tibial pulses palpable bilaterally. Capillary refill normal to all digits.  No cyanosis or clubbing noted. Pedal hair growth normal.  Neurologic Normal speech. Oriented to person, place, and time. Epicritic sensation to light touch grossly present bilaterally.  Dermatologic Right fourth interspace skin maceration without breakdown of skin.  No deep wound noted.   Superinfection clinically appreciated.  Orthopedic: Normal joint ROM without pain or crepitus bilaterally. No visible deformities. No bony tenderness.   Radiographs: None Assessment:   1. Other specified local infections of the skin and subcutaneous tissue    Plan:  Patient was evaluated and treated and all questions answered.  right fourth interspace superinfection - All questions and concerns were discussed with the patient in extensive detail given the amount of superinfection is present she would benefit from Lamisil for 30 days as well as doxycycline  for 14 days I discussed the importance in treatment and they state understanding. Shoe gear modification discussed.-  No follow-ups on file.

## 2023-11-24 ENCOUNTER — Encounter: Admitting: Family Medicine

## 2023-11-24 ENCOUNTER — Telehealth: Admitting: Physician Assistant

## 2023-11-24 DIAGNOSIS — B3731 Acute candidiasis of vulva and vagina: Secondary | ICD-10-CM | POA: Diagnosis not present

## 2023-11-24 MED ORDER — FLUCONAZOLE 150 MG PO TABS
150.0000 mg | ORAL_TABLET | ORAL | 0 refills | Status: AC | PRN
Start: 2023-11-24 — End: ?

## 2023-11-24 NOTE — Progress Notes (Signed)
 Virtual Visit Consent   Angela Middleton, you are scheduled for a virtual visit with a [facility] provider today. Just as with appointments in the office, your consent must be obtained to participate. Your consent will be active for this visit and any virtual visit you may have with one of our providers in the next 365 days. If you have a MyChart account, a copy of this consent can be sent to you electronically.  As this is a virtual visit, video technology does not allow for your provider to perform a traditional examination. This may limit your provider's ability to fully assess your condition. If your provider identifies any concerns that need to be evaluated in person or the need to arrange testing (such as labs, EKG, etc.), we will make arrangements to do so. Although advances in technology are sophisticated, we cannot ensure that it will always work on either your end or our end. If the connection with a video visit is poor, the visit may have to be switched to a telephone visit. With either a video or telephone visit, we are not always able to ensure that we have a secure connection.  By engaging in this virtual visit, you consent to the provision of healthcare and authorize for your insurance to be billed (if applicable) for the services provided during this visit. Depending on your insurance coverage, you may receive a charge related to this service.  I need to obtain your verbal consent now. Are you willing to proceed with your visit today? Angela Middleton has provided verbal consent on 11/24/2023 for a virtual visit (video or telephone). Angela CHRISTELLA Dickinson, PA-C  Date: 11/24/2023 4:13 PM   Virtual Visit via Video Note   I, Angela Middleton, connected with  Angela Middleton  ([id number], 17-Oct-2000) on 11/24/23 at  4:00 PM EDT by a video-enabled telemedicine application and verified that I am speaking with the correct person using two identifiers.  Location: Patient: Virtual Visit  Location Patient: Home Provider: Virtual Visit Location Provider: Home Office   I discussed the limitations of evaluation and management by telemedicine and the availability of in person appointments. The patient expressed understanding and agreed to proceed.    History of Present Illness: Angela Middleton is a 23 y.o. who identifies as a female who was assigned female at birth, and is being seen today for vaginal irritation.  HPI: Vaginal Discharge The patient's primary symptoms include genital itching and vaginal discharge. The current episode started in the past 7 days (3 days). The problem occurs constantly. The problem has been unchanged. The pain is mild. She is not pregnant (on menstrual cycle now). Pertinent negatives include no abdominal pain, back pain, chills, dysuria, fever, flank pain, frequency, nausea or urgency. Associated symptoms comments: Pain at vaginal opening with thick, white discharge. The vaginal discharge was white and thick. The vaginal bleeding is typical of menses. She has not been passing clots. She has not been passing tissue. The symptoms are aggravated by tactile pressure (tampons-changed brand, possibly source). She has tried nothing for the symptoms. The treatment provided no relief.     Problems:  Patient Active Problem List   Diagnosis Date Noted   Lower abdominal pain 06/04/2023   Missed period 06/04/2023   Encounter for medication refill 06/04/2023   History of asthma 06/04/2023   Pain of joint of left ankle and foot 12/16/2020   Salter-Harris Type I fracture of lower end of fibula 07/07/2012    Allergies:  Allergies  Allergen  Reactions   Shrimp [Shellfish Allergy] Nausea And Vomiting   Medications:  Current Outpatient Medications:    fluconazole  (DIFLUCAN ) 150 MG tablet, Take 1 tablet (150 mg total) by mouth every 3 (three) days as needed., Disp: 2 tablet, Rfl: 0   albuterol  (VENTOLIN  HFA) 108 (90 Base) MCG/ACT inhaler, Inhale 2 puffs into the  lungs every 6 (six) hours as needed for wheezing or shortness of breath., Disp: 18 g, Rfl: 0   benzonatate  (TESSALON  PERLES) 100 MG capsule, Take 1 capsule (100 mg total) by mouth 3 (three) times daily as needed., Disp: 20 capsule, Rfl: 0   cetirizine  (ZYRTEC  ALLERGY) 10 MG tablet, Take 1 tablet (10 mg total) by mouth daily for 14 days. (Patient not taking: Reported on 09/22/2023), Disp: 14 tablet, Rfl: 0   clotrimazole-betamethasone (LOTRISONE) cream, Apply 1 Application topically daily., Disp: 30 g, Rfl: 0   doxycycline  (VIBRA -TABS) 100 MG tablet, Take 1 tablet (100 mg total) by mouth 2 (two) times daily., Disp: 28 tablet, Rfl: 0   fluticasone  (FLONASE ) 50 MCG/ACT nasal spray, Place 2 sprays into both nostrils daily. (Patient not taking: Reported on 07/19/2023), Disp: 16 g, Rfl: 6   fluticasone  (FLOVENT  HFA) 110 MCG/ACT inhaler, Inhale 2 puffs into the lungs daily., Disp: 1 each, Rfl: 0   lidocaine  (XYLOCAINE ) 2 % solution, Swallow 5mL every 4-6 hours as needed for sore throat (Patient not taking: Reported on 09/22/2023), Disp: 100 mL, Rfl: 0   methylPREDNISolone  (MEDROL  DOSEPAK) 4 MG TBPK tablet, Take as directed., Disp: 1 each, Rfl: 0   montelukast (SINGULAIR) 10 MG tablet, Take 10 mg by mouth daily., Disp: , Rfl:    nitrofurantoin , macrocrystal-monohydrate, (MACROBID ) 100 MG capsule, Take 1 capsule (100 mg total) by mouth 2 (two) times daily., Disp: 10 capsule, Rfl: 0   predniSONE  (STERAPRED UNI-PAK 21 TAB) 10 MG (21) TBPK tablet, Take by mouth daily. Take 6 tabs by mouth daily  for 1 days, then 5 tabs for 1 days, then 4 tabs for 1 days, then 3 tabs for 1 days, 2 tabs for 1 days, then 1 tab by mouth daily for 1 days (Patient not taking: Reported on 09/22/2023), Disp: 21 tablet, Rfl: 0   sodium chloride (OCEAN) 0.65 % SOLN nasal spray, Place 1 spray into both nostrils as needed for up to 5 days for congestion., Disp: 15 mL, Rfl: 0   spironolactone (ALDACTONE) 50 MG tablet, Take 50 mg by mouth daily.,  Disp: , Rfl:    terbinafine (LAMISIL) 250 MG tablet, Take 1 tablet (250 mg total) by mouth daily., Disp: 30 tablet, Rfl: 0  Observations/Objective: Patient is well-developed, well-nourished in no acute distress.  Resting comfortably at home.  Head is normocephalic, atraumatic.  No labored breathing.  Speech is clear and coherent with logical content.  Patient is alert and oriented at baseline.    Assessment and Plan: 1. Yeast vaginitis (Primary) - fluconazole  (DIFLUCAN ) 150 MG tablet; Take 1 tablet (150 mg total) by mouth every 3 (three) days as needed.  Dispense: 2 tablet; Refill: 0  - Symptoms consistent with yeast vaginitis - Fluconazole  prescribed - Limit bubble baths, scented lotions/soaps/detergents - Limit tight fitting clothing - Seek on person evaluation if not improving or if symptoms worsen   Follow Up Instructions: I discussed the assessment and treatment plan with the patient. The patient was provided an opportunity to ask questions and all were answered. The patient agreed with the plan and demonstrated an understanding of the instructions.  A copy of  instructions were sent to the patient via MyChart unless otherwise noted below.    The patient was advised to call back or seek an in-person evaluation if the symptoms worsen or if the condition fails to improve as anticipated.    Angela CHRISTELLA Dickinson, PA-C

## 2023-11-24 NOTE — Patient Instructions (Signed)
 [name], thank you for joining [name], PA-C for today's virtual visit.  While this provider is not your primary care provider (PCP), if your PCP is located in our provider database this encounter information will be shared with them immediately following your visit.   A [facility] MyChart account gives you access to today's visit and all your visits, tests, and labs performed at [facility]  click here if you don't have a  MyChart account or go to [email or web address]  Consent: (Patient) Angela Middleton provided verbal consent for this virtual visit at the beginning of the encounter.  Current Medications:  Current Outpatient Medications:    fluconazole  (DIFLUCAN ) 150 MG tablet, Take 1 tablet (150 mg total) by mouth every 3 (three) days as needed., Disp: 2 tablet, Rfl: 0   albuterol  (VENTOLIN  HFA) 108 (90 Base) MCG/ACT inhaler, Inhale 2 puffs into the lungs every 6 (six) hours as needed for wheezing or shortness of breath., Disp: 18 g, Rfl: 0   benzonatate  (TESSALON  PERLES) 100 MG capsule, Take 1 capsule (100 mg total) by mouth 3 (three) times daily as needed., Disp: 20 capsule, Rfl: 0   cetirizine  (ZYRTEC  ALLERGY) 10 MG tablet, Take 1 tablet (10 mg total) by mouth daily for 14 days. (Patient not taking: Reported on 09/22/2023), Disp: 14 tablet, Rfl: 0   clotrimazole-betamethasone (LOTRISONE) cream, Apply 1 Application topically daily., Disp: 30 g, Rfl: 0   doxycycline  (VIBRA -TABS) 100 MG tablet, Take 1 tablet (100 mg total) by mouth 2 (two) times daily., Disp: 28 tablet, Rfl: 0   fluticasone  (FLONASE ) 50 MCG/ACT nasal spray, Place 2 sprays into both nostrils daily. (Patient not taking: Reported on 07/19/2023), Disp: 16 g, Rfl: 6   fluticasone  (FLOVENT  HFA) 110 MCG/ACT inhaler, Inhale 2 puffs into the lungs daily., Disp: 1 each, Rfl: 0   lidocaine  (XYLOCAINE ) 2 % solution, Swallow 5mL every 4-6 hours as needed for sore throat (Patient not taking:  Reported on 09/22/2023), Disp: 100 mL, Rfl: 0   methylPREDNISolone  (MEDROL  DOSEPAK) 4 MG TBPK tablet, Take as directed., Disp: 1 each, Rfl: 0   montelukast (SINGULAIR) 10 MG tablet, Take 10 mg by mouth daily., Disp: , Rfl:    nitrofurantoin , macrocrystal-monohydrate, (MACROBID ) 100 MG capsule, Take 1 capsule (100 mg total) by mouth 2 (two) times daily., Disp: 10 capsule, Rfl: 0   predniSONE  (STERAPRED UNI-PAK 21 TAB) 10 MG (21) TBPK tablet, Take by mouth daily. Take 6 tabs by mouth daily  for 1 days, then 5 tabs for 1 days, then 4 tabs for 1 days, then 3 tabs for 1 days, 2 tabs for 1 days, then 1 tab by mouth daily for 1 days (Patient not taking: Reported on 09/22/2023), Disp: 21 tablet, Rfl: 0   sodium chloride (OCEAN) 0.65 % SOLN nasal spray, Place 1 spray into both nostrils as needed for up to 5 days for congestion., Disp: 15 mL, Rfl: 0   spironolactone (ALDACTONE) 50 MG tablet, Take 50 mg by mouth daily., Disp: , Rfl:    terbinafine (LAMISIL) 250 MG tablet, Take 1 tablet (250 mg total) by mouth daily., Disp: 30 tablet, Rfl: 0   Medications ordered in this encounter:  Meds ordered this encounter  Medications   fluconazole  (DIFLUCAN ) 150 MG tablet    Sig: Take 1 tablet (150 mg total) by mouth every 3 (three) days as needed.    Dispense:  2 tablet    Refill:  0    Supervising Provider:   BLAISE, PHILIP  MALVA [[id number]]     *If you need refills on other medications prior to your next appointment, please contact your pharmacy*  Follow-Up: Call back or seek an in-person evaluation if the symptoms worsen or if the condition fails to improve as anticipated.  [facility] [phone]  Other Instructions Vaginal Yeast Infection, Adult  Vaginal yeast infection is a condition that causes vaginal discharge as well as soreness, swelling, and redness (inflammation) of the vagina. This is a common condition. Some women get this infection frequently. What are the causes? This condition is  caused by a change in the normal balance of the yeast (Candida) and normal bacteria that live in the vagina. This change causes an overgrowth of yeast, which causes the inflammation. What increases the risk? The condition is more likely to develop in women who: Take antibiotic medicines. Have diabetes. Take birth control pills. Are pregnant. Douche often. Have a weak body defense system (immune system). Have been taking steroid medicines for a long time. Frequently wear tight clothing. What are the signs or symptoms? Symptoms of this condition include: White, thick, creamy vaginal discharge. Swelling, itching, redness, and irritation of the vagina. The lips of the vagina (labia) may be affected as well. Pain or a burning feeling while urinating. Pain during sex. How is this diagnosed? This condition is diagnosed based on: Your medical history. A physical exam. A pelvic exam. Your health care provider will examine a sample of your vaginal discharge under a microscope. Your health care provider may send this sample for testing to confirm the diagnosis. How is this treated? This condition is treated with medicine. Medicines may be over-the-counter or prescription. You may be told to use one or more of the following: Medicine that is taken by mouth (orally). Medicine that is applied as a cream (topically). Medicine that is inserted directly into the vagina (suppository). Follow these instructions at home: Take or apply over-the-counter and prescription medicines only as told by your health care provider. Do not use tampons until your health care provider approves. Do not have sex until your infection has cleared. Sex can prolong or worsen your symptoms of infection. Ask your health care provider when it is safe to resume sexual activity. Keep all follow-up visits. This is important. How is this prevented?  Do not wear tight clothes, such as pantyhose or tight pants. Wear breathable  cotton underwear. Do not use douches, perfumed soap, creams, or powders. Wipe from front to back after using the toilet. If you have diabetes, keep your blood sugar levels under control. Ask your health care provider for other ways to prevent yeast infections. Contact a health care provider if: You have a fever. Your symptoms go away and then return. Your symptoms do not get better with treatment. Your symptoms get worse. You have new symptoms. You develop blisters in or around your vagina. You have blood coming from your vagina and it is not your menstrual period. You develop pain in your abdomen. Summary Vaginal yeast infection is a condition that causes discharge as well as soreness, swelling, and redness (inflammation) of the vagina. This condition is treated with medicine. Medicines may be over-the-counter or prescription. Take or apply over-the-counter and prescription medicines only as told by your health care provider. Do not douche. Resume sexual activity or use of tampons as instructed by your health care provider. Contact a health care provider if your symptoms do not get better with treatment or your symptoms go away and then  return. This information is not intended to replace advice given to you by your health care provider. Make sure you discuss any questions you have with your health care provider. Document Revised: 04/15/2020 Document Reviewed: 04/15/2020 Elsevier Patient Education  2024 Elsevier Inc.   If you have been instructed to have an in-person evaluation today at a local Urgent Care facility, please use the link below. It will take you to a list of all of our available [facility], including address, phone number and hours of operation. Please do not delay care.  [facility]  If you or a family member do not have a primary care provider, use the link below to schedule a visit and establish care. When you choose a [facility] primary care  physician or advanced practice provider, you gain a long-term partner in health. Find a Primary Care Provider  Learn more about [facility]'s in-office and virtual care options: [facility] - Get Care Now

## 2023-11-24 NOTE — Progress Notes (Signed)
 This encounter was created in error - please disregard.   Roosvelt Mater, PA-C

## 2023-11-30 ENCOUNTER — Telehealth: Payer: Self-pay

## 2023-11-30 NOTE — Telephone Encounter (Signed)
 Orthotics are here Teaching laboratory technician form signed and on file Appt needed for fitting/ pu

## 2023-12-02 NOTE — Telephone Encounter (Signed)
 LVM to schedule orthotic fitting/ pu  Orthotics in [place] bin

## 2024-01-05 ENCOUNTER — Ambulatory Visit

## 2024-01-05 DIAGNOSIS — L700 Acne vulgaris: Secondary | ICD-10-CM | POA: Diagnosis not present

## 2024-01-05 DIAGNOSIS — L720 Epidermal cyst: Secondary | ICD-10-CM | POA: Diagnosis not present

## 2024-01-05 DIAGNOSIS — D229 Melanocytic nevi, unspecified: Secondary | ICD-10-CM | POA: Diagnosis not present

## 2024-01-05 MED ORDER — ADAPALENE 0.3 % EX GEL: CUTANEOUS | 5 refills | Status: AC

## 2024-01-05 MED ORDER — CLINDAMYCIN PHOS (TWICE-DAILY) 1 % EX GEL: Freq: Two times a day (BID) | CUTANEOUS | 5 refills | Status: AC

## 2024-01-05 NOTE — Progress Notes (Signed)
    Subjective   Angela Middleton is a 23 y.o. female who presents for the following: Lesion(s) of concern . Patient is established patient   Today patient reports: Areas of concern on her face  Review of Systems:    No other skin or systemic complaints except as noted in HPI or Assessment and Plan.  The following portions of the chart were reviewed this encounter and updated as appropriate: medications, allergies, medical history  Relevant Medical History:  HS  Objective  (SKPE) Well appearing patient in no apparent distress; mood and affect are within normal limits. Examination was performed of the: Focused Exam of: Face   Examination notable for: Nevus/nevi: Scattered well-demarcated, regular, pigmented macule(s) and/or papule(s)  , Milia: Multiple 2-3 mm white papules , Acne vulgaris: Scattered open and closed comedones on the face. Red, inflammatory papules and pustules on face      Assessment & Plan  (SKAP)   BENIGN SKIN FINDINGS  - Nevus/Multiple Benign Nevi  - Milia - Reassurance provided regarding the benign appearance of lesions noted on exam today; no treatment is indicated in the absence of symptoms/changes. - Reinforced importance of photoprotective strategies including liberal and frequent sunscreen use of a broad-spectrum SPF 30 or greater, use of protective clothing, and sun avoidance for prevention of cutaneous malignancy and photoaging.  Counseled patient on the importance of regular self-skin monitoring as well as routine clinical skin examinations as scheduled.   Acne vulgaris face- mild and comedonal - Chronic and persistent condition with duration or expected duration over one year. Condition is symptomatic and bothersome to patient. Patient is flaring and not currently at treatment goal.  - Discussed various treatment options with patient, as well as need for consistent use for at least 6-12 weeks for full efficacy.  - Reviewed treatment options, including  side effects of topical agents, oral antibiotics, OCPs (if female), oral spironolactone (if female), and isotretinoin. Discussed that isotretinoin is the most effective  - After discussion opted to initiate: start adapalene  0.3% gel in the evening. Educated patient about proper use and potential side effects, including dryness, irritation, sun sensitivity, and transient worsening of acne. - Start topical clindamycin  gel 1% daily to active areas as needed for breakouts   Level of service outlined above   Patient instructions (SKPI)   Procedures, orders, diagnosis for this visit:    There are no diagnoses linked to this encounter.  Return to clinic: Return if symptoms worsen or fail to improve.  I, Emerick Ege, CMA am acting as scribe for Lauraine JAYSON Kanaris, MD.  Documentation: I have reviewed the above documentation for accuracy and completeness, and I agree with the above.  Lauraine JAYSON Kanaris, MD

## 2024-01-05 NOTE — Patient Instructions (Addendum)
 Plan for Acne  In the morning: - Cleanse face with a gentle cleanser OR benzoyl peroxide wash if instructed to use this at your visit(can be purchased over the counter- examples at the bottom) - Wipe face with clindamycin  wipe if prescribed at your visit. This can be used on entire face/chest/back or as a spot treatment to active acne areas  - Apply an oil-free moisturizer  In the evening: - Cleanse face with a regular gentle face wash - Wait for skin to completely dry - Apply a pea sized amount of your retinoid (tretinoin  or adapalene) to your finger. Dot this around your face, and then rub in - If your skin gets dry, you can follow this up with an oil-free moisturizer  If you were instructed to take minocycline or doxycycline. This is the antibiotic we talked about that you will take twice per day for a 3 month course. Take the medication with food, and don't lie down right after taking it, because it can give you heart burn if you lie down right away.  How to use your Acne creams  Some creams for acne PREVENT acne and some creams TARGET pimples you can see.  Antibiotic creams (erythromycin, benzoyl peroxide, clindamycin  and sulfur sulfacetamide)  How to apply: generally applied once daily either as a spot treatment or all over the face (see above)  Retinoids (differin/adapalene, retin-A /tretinoin  or tazorac) work by PREVENTING acne.  These medicines are good to control acne, but may cause dryness and increase your risk of sunburn.  Do not use if you are PREGNANT or BREAST FEEDING. It takes 4-6 weeks to have results and the acne may worsen in the beginning.  Some retinoids are stronger than others, but are also more irritating. These are prescription topical medications, used to treat acne, sun damage, fine wrinkles, and several other skin changes on the face. Medications in this family include tretinoin /Retin-A , adapalene/Differin, and Tazorac, among others.   A Note on Cosmetic  Use: - Please note, if you are over the age of 7, insurance will typically not cover these medications. If they are recommended to you for non-medically necessary reasons, you may choose to pay for the medication out of pocket. Prices vary, but a tube of generic tretinoin  can often be purchased for ~$60-100 (this size often lasts several months). This varies considerably, and we recommended that you check www.goodrx.com for prescription discount coupons and to compare prices across pharmacies.   How to apply: Use at night time (sunlight makes them inactive). If you wash your face at night, let the skin dry for 20 minutes before applying. Apply to all areas that have breakouts (NOT just to pimples you see). Use a PEA-SIZED amount for the entire face (no more) Dot it on your skin and connect the dots Avoid eyes and lips These may cause irritation in the beginning. To decrease irritation, do the following: 1st month: Use twice a week for the first month  2nd month: Use every other night 3rd month and on: Use every night  Cleansing your skin: -   Do not use harsh "acne" soaps and astringents. - Use water to clean your face.  - If you wear make-up, sunscreen or creams, use non-comedogenic (non-pore clogging) gentle moisturizing wash or cream. Examples include: Cetaphil, Neutrogena, Clinique  Moisturizers and sunscreen: Apply a "non-comedogenic" (non-pore clogging) lotion with sunscreen in the morning. You may need to re-apply during the day. Neutrogena, Eucerin, Clinique, Vanicream    If you have dryness  or irritation, try these tips: Decrease use to every other night or twice weekly as tolerated  Wash the retinoid off after 1 hour and apply a "non-comedogenic" (non-pore-clogging) lotion If dryness or irritation continues, stop using the retinoid.   Benzoyl peroxide washes 3-5% (brand name includes Neutragena Clear Pore, CereVe Acne Foaming Cream Cleanser, Differin Cleanser) that you use  in the shower. Can bleach linens (clothes, towels, etc), will NOT bleach your skin or hair). Dry off with a white towel so it doesn't take the color out of clothing and colored towels.         Due to recent changes in healthcare laws, you may see results of your pathology and/or laboratory studies on MyChart before the doctors have had a chance to review them. We understand that in some cases there may be results that are confusing or concerning to you. Please understand that not all results are received at the same time and often the doctors may need to interpret multiple results in order to provide you with the best plan of care or course of treatment. Therefore, we ask that you please give us  2 business days to thoroughly review all your results before contacting the office for clarification. Should we see a critical lab result, you will be contacted sooner.   If You Need Anything After Your Visit  If you have any questions or concerns for your doctor, please call our main line at [phone] and press option 4 to reach your doctor's medical assistant. If no one answers, please leave a voicemail as directed and we will return your call as soon as possible. Messages left after 4 pm will be answered the following business day.   You may also send us  a message via MyChart. We typically respond to MyChart messages within 1-2 business days.  For prescription refills, please ask your pharmacy to contact our office. Our fax number is [phone].  If you have an urgent issue when the clinic is closed that cannot wait until the next business day, you can page your doctor at the number below.    Please note that while we do our best to be available for urgent issues outside of office hours, we are not available 24/7.   If you have an urgent issue and are unable to reach us , you may choose to seek medical care at your doctor's office, retail clinic, urgent care center, or emergency room.  If you  have a medical emergency, please immediately call 911 or go to the emergency department.  Pager Numbers  - Dr. Hester: [phone]  - Dr. Jackquline: [phone]  - Dr. Claudene: [phone]   - Dr. Raymund: [phone]  In the event of inclement weather, please call our main line at [phone] for an update on the status of any delays or closures.  Dermatology Medication Tips: Please keep the boxes that topical medications come in in order to help keep track of the instructions about where and how to use these. Pharmacies typically print the medication instructions only on the boxes and not directly on the medication tubes.   If your medication is too expensive, please contact our office at [phone] option 4 or send us  a message through MyChart.   We are unable to tell what your co-pay for medications will be in advance as this is different depending on your insurance coverage. However, we may be able to find a substitute medication at lower cost or fill out paperwork to get insurance to cover  a needed medication.   If a prior authorization is required to get your medication covered by your insurance company, please allow us  1-2 business days to complete this process.  Drug prices often vary depending on where the prescription is filled and some pharmacies may offer cheaper prices.  The website www.goodrx.com contains coupons for medications through different pharmacies. The prices here do not account for what the cost may be with help from insurance (it may be cheaper with your insurance), but the website can give you the price if you did not use any insurance.  - You can print the associated coupon and take it with your prescription to the pharmacy.  - You may also stop by our office during regular business hours and pick up a GoodRx coupon card.  - If you need your prescription sent electronically to a different pharmacy, notify our office through [facility] or by phone  at [phone] option 4.     Si Usted Necesita Algo Despus de Su Visita  Tambin puede enviarnos un mensaje a travs de Clinical cytogeneticist. Por lo general respondemos a los mensajes de MyChart en el transcurso de 1 a 2 das hbiles.  Para renovar recetas, por favor pida a su farmacia que se ponga en contacto con nuestra oficina. Randi lakes de fax es [place] [phone].  Si tiene un asunto urgente cuando la clnica est cerrada y que no puede esperar hasta el siguiente da hbil, puede llamar/localizar a su doctor(a) al nmero que aparece a continuacin.   Por favor, tenga en cuenta que aunque hacemos todo lo posible para estar disponibles para asuntos urgentes fuera del horario de [place], no estamos disponibles las 24 horas del da, los 7 [address]  [address] de la [place].   Si tiene un problema urgente y no puede comunicarse con nosotros, puede optar por buscar atencin mdica  en el consultorio de su doctor(a), en una clnica privada, en un centro de atencin urgente o en una sala de emergencias.  Si tiene Engineer, drilling, por favor llame inmediatamente al 911 o vaya a la sala de emergencias.  Nmeros de bper  - Dr. Hester: [phone]  - Dra. Jackquline: [phone]  - Dr. Claudene: [phone]  - Dra. Kitts: [phone]  En caso de inclemencias del [place], por favor llame a nuestra lnea principal al [phone] para una actualizacin sobre el estado de cualquier retraso o cierre.  Consejos para la medicacin en dermatologa: Por favor, guarde las cajas en las que vienen los medicamentos de uso tpico para ayudarle a seguir las instrucciones sobre dnde y cmo usarlos. Las farmacias generalmente imprimen las instrucciones del medicamento slo en las cajas y no directamente en los tubos del [place].   Si su medicamento es muy caro, por favor, pngase en contacto con landry rieger llamando al [phone] y presione la opcin 4 o envenos un mensaje a travs de Clinical cytogeneticist.   No podemos  decirle cul ser su copago por los medicamentos por adelantado ya que esto es diferente dependiendo de la cobertura de su seguro. Sin embargo, es posible que podamos encontrar un medicamento sustituto a Audiological scientist un formulario para que el seguro cubra el medicamento que se considera necesario.   Si se requiere una autorizacin previa para que su compaa de seguros malta su medicamento, por favor permtanos de 1 a 2 das hbiles para completar este proceso.  Los precios de los medicamentos varan con frecuencia dependiendo del Environmental consultant de dnde se surte la receta y iraq  pueden ofrecer precios ms baratos.  El sitio web www.goodrx.com tiene cupones para medicamentos de Health and safety inspector. Los precios aqu no tienen en cuenta lo que podra costar con la ayuda del seguro (puede ser ms barato con su seguro), pero el sitio web puede darle el precio si no utiliz Tourist information centre manager.  - Puede imprimir el cupn correspondiente y llevarlo con su receta a la farmacia.  - Tambin puede pasar por nuestra oficina durante el horario de atencin regular y Education officer, museum una tarjeta de cupones de GoodRx.  - Si necesita que su receta se enve electrnicamente a una farmacia diferente, informe a nuestra oficina a travs de MyChart de [place] o por telfono llamando al [phone] y presione la opcin 4.

## 2024-01-10 ENCOUNTER — Telehealth

## 2024-01-10 ENCOUNTER — Ambulatory Visit
Admission: RE | Admit: 2024-01-10 | Discharge: 2024-01-10 | Disposition: A | Discharge status: Home / Self Care | Source: Ambulatory Visit | Attending: Emergency Medicine | Admitting: Emergency Medicine

## 2024-01-10 VITALS — BP 108/75 | HR 105 | Temp 99.0°F | Resp 20

## 2024-01-10 DIAGNOSIS — J4521 Mild intermittent asthma with (acute) exacerbation: Secondary | ICD-10-CM | POA: Diagnosis not present

## 2024-01-10 DIAGNOSIS — B349 Viral infection, unspecified: Secondary | ICD-10-CM

## 2024-01-10 LAB — POC COVID19/FLU A&B COMBO
Covid Antigen, POC: NEGATIVE
Influenza A Antigen, POC: NEGATIVE
Influenza B Antigen, POC: NEGATIVE

## 2024-01-10 LAB — POCT RAPID STREP A (OFFICE): Rapid Strep A Screen: NEGATIVE

## 2024-01-10 MED ORDER — BENZONATATE 100 MG PO CAPS: 100.0000 mg | ORAL_CAPSULE | Freq: Three times a day (TID) | ORAL | 0 refills | Status: AC

## 2024-01-10 MED ORDER — PREDNISONE 10 MG (21) PO TBPK: ORAL_TABLET | Freq: Every day | ORAL | 0 refills | Status: AC

## 2024-01-10 MED ORDER — ALBUTEROL SULFATE HFA 108 (90 BASE) MCG/ACT IN AERS: 2.0000 | INHALATION_SPRAY | Freq: Four times a day (QID) | RESPIRATORY_TRACT | 2 refills | Status: AC | PRN

## 2024-01-10 NOTE — ED Provider Notes (Signed)
 [facility]    CSN: [id number] Arrival date & time: 01/10/24  1153      History   Chief Complaint Chief Complaint  Patient presents with   Chills    Hot cold chills asthma, flaring up, congested, throat, hurts - Entered by patient   Cough   Wheezing   Shortness of Breath   Nasal Congestion   Sore Throat    HPI Angela Middleton is a 23 y.o. female.   Patient presents for evaluation of chills, sore throat and nasal congestion beginning 1 day ago, experiencing bilateral ear popping worse to the left side today.  Over the past 4 days has had a productive cough, shortness of breath and wheezing.  Has attempted use of albuterol  nebulizer and inhaler with minimal relief.  No known sick contacts.  Tolerable to food and liquids.  Additionally has attempted Advil  cold and flu, Mucinex and nasal spray.   Past Medical History:  Diagnosis Date   Asthma    Hidradenitis suppurativa     Patient Active Problem List   Diagnosis Date Noted   Lower abdominal pain 06/04/2023   Missed period 06/04/2023   Encounter for medication refill 06/04/2023   History of asthma 06/04/2023   Pain of joint of left ankle and foot 12/16/2020   Salter-Harris Type I fracture of lower end of fibula 07/07/2012    Past Surgical History:  Procedure Laterality Date   NO PAST SURGERIES      OB History     Gravida  2   Para      Term      Preterm      AB  1   Living         SAB  1   IAB      Ectopic      Multiple      Live Births               Home Medications    Prior to Admission medications   Medication Sig Start Date End Date Taking? Authorizing Provider  Adapalene  0.3 % gel Apply 2-3 times weekly at night to dry skin after cleaning. Increase frequency up to nightly as tolerated. 01/05/24   Raymund Lauraine BROCKS, MD  albuterol  (VENTOLIN  HFA) 108 [phone] Base) MCG/ACT inhaler Inhale 2 puffs into the lungs every 6 (six) hours as needed for wheezing or shortness of breath.  09/02/23   Christan Defranco, Shelba SAUNDERS, NP  benzonatate  (TESSALON  PERLES) 100 MG capsule Take 1 capsule (100 mg total) by mouth 3 (three) times daily as needed. 04/04/23   Rolan Berthold, PA-C  cetirizine  (ZYRTEC  ALLERGY) 10 MG tablet Take 1 tablet (10 mg total) by mouth daily for 14 days. Patient not taking: Reported on 09/22/2023 07/19/23 08/02/23  Corlis Burnard DEL, NP  clindamycin  (CLINDAGEL) 1 % gel Apply topically 2 (two) times daily. Apply to the affected area of skin daily 01/05/24   Raymund Lauraine BROCKS, MD  clotrimazole -betamethasone  (LOTRISONE ) cream Apply 1 Application topically daily. 11/11/23   Tobie Franky SQUIBB, DPM  doxycycline  (VIBRA -TABS) 100 MG tablet Take 1 tablet (100 mg total) by mouth 2 (two) times daily. 11/11/23   Tobie Franky SQUIBB, DPM  fluconazole  (DIFLUCAN ) 150 MG tablet Take 1 tablet (150 mg total) by mouth every 3 (three) days as needed. 11/24/23   Vivienne Delon HERO, PA-C  fluticasone  (FLONASE ) 50 MCG/ACT nasal spray Place 2 sprays into both nostrils daily. Patient not taking: Reported on 07/19/2023 12/06/22   Lavell Lye  A, FNP  fluticasone  (FLOVENT  HFA) 110 MCG/ACT inhaler Inhale 2 puffs into the lungs daily. 05/10/23 06/09/23  Blair, Diane W, FNP  lidocaine  (XYLOCAINE ) 2 % solution Swallow 5mL every 4-6 hours as needed for sore throat Patient not taking: Reported on 09/22/2023 08/20/23   Burnette, Lurene M, PA-C  methylPREDNISolone  (MEDROL  DOSEPAK) 4 MG TBPK tablet Take as directed. 04/04/23   Rolan Berthold, PA-C  montelukast (SINGULAIR) 10 MG tablet Take 10 mg by mouth daily.    [provider]  nitrofurantoin , macrocrystal-monohydrate, (MACROBID ) 100 MG capsule Take 1 capsule (100 mg total) by mouth 2 (two) times daily. 09/22/23   Corlis Burnard DEL, NP  predniSONE  (STERAPRED UNI-PAK 21 TAB) 10 MG (21) TBPK tablet Take by mouth daily. Take 6 tabs by mouth daily  for 1 days, then 5 tabs for 1 days, then 4 tabs for 1 days, then 3 tabs for 1 days, 2 tabs for 1 days, then 1 tab by mouth daily for 1  days Patient not taking: Reported on 09/22/2023 09/02/23   Teresa Shelba SAUNDERS, NP  sodium chloride (OCEAN) 0.65 % SOLN nasal spray Place 1 spray into both nostrils as needed for up to 5 days for congestion. 04/04/23 04/09/23  Rolan Berthold, PA-C  spironolactone (ALDACTONE) 50 MG tablet Take 50 mg by mouth daily. 06/29/23   [provider]  terbinafine  (LAMISIL ) 250 MG tablet Take 1 tablet (250 mg total) by mouth daily. 11/11/23   Tobie Franky SQUIBB, DPM  SPRINTEC 28 0.25-35 MG-MCG tablet Take 1 tablet by mouth daily. 10/05/19 03/06/20  [provider]    Family History Family History  Problem Relation Age of Onset   Asthma Mother    Healthy Mother    Diabetes Father    Healthy Father    Hypertension Father    High Cholesterol Father    Diabetes Paternal Grandfather    Hypertension Paternal Grandfather    Diabetes Paternal Grandmother    Hypertension Paternal Grandmother    Diabetes Maternal Uncle    Hypertension Maternal Uncle     Social History Social History   Tobacco Use   Smoking status: Never   Smokeless tobacco: Never  Vaping Use   Vaping status: Some Days   Substances: Nicotine, THC, Flavoring  Substance Use Topics   Alcohol use: Yes    Comment: socially   Drug use: Yes    Types: Marijuana    Comment: 4-5 times weekly     Allergies   Shrimp [shellfish allergy]   Review of Systems Review of Systems  Constitutional:  Positive for chills. Negative for activity change, appetite change, diaphoresis, fatigue, fever and unexpected weight change.  HENT:  Positive for congestion and sore throat. Negative for dental problem, drooling, ear discharge, ear pain, facial swelling, hearing loss, mouth sores, nosebleeds, postnasal drip, rhinorrhea, sinus pressure, sinus pain, sneezing, tinnitus, trouble swallowing and voice change.   Respiratory:  Positive for cough, shortness of breath and wheezing. Negative for apnea, choking, chest tightness and stridor.    Gastrointestinal: Negative.      Physical Exam Triage Vital Signs ED Triage Vitals  Encounter Vitals Group     BP 01/10/24 1204 108/75     Girls Systolic BP Percentile --      Girls Diastolic BP Percentile --      Boys Systolic BP Percentile --      Boys Diastolic BP Percentile --      Pulse Rate 01/10/24 1204 (!) 105     Resp  01/10/24 1204 20     Temp 01/10/24 1204 99 F (37.2 C)     Temp Source 01/10/24 1204 Oral     SpO2 01/10/24 1204 98 %     Weight --      Height --      Head Circumference --      Peak Flow --      Pain Score 01/10/24 1209 7     Pain Loc --      Pain Education --      Exclude from Growth Chart --    No data found.  Updated Vital Signs BP 108/75 (BP Location: Left Arm)   Pulse (!) 105   Temp 99 F (37.2 C) (Oral)   Resp 20   LMP 01/09/2024 (Exact Date)   SpO2 98%   Visual Acuity Right Eye Distance:   Left Eye Distance:   Bilateral Distance:    Right Eye Near:   Left Eye Near:    Bilateral Near:     Physical Exam Constitutional:      Appearance: Normal appearance.  HENT:     Head: Normocephalic.     Right Ear: Tympanic membrane, ear canal and external ear normal.     Left Ear: Tympanic membrane, ear canal and external ear normal.     Nose: Congestion present.     Mouth/Throat:     Pharynx: Posterior oropharyngeal erythema present. No oropharyngeal exudate.  Eyes:     Extraocular Movements: Extraocular movements intact.  Cardiovascular:     Rate and Rhythm: Normal rate and regular rhythm.     Pulses: Normal pulses.     Heart sounds: Normal heart sounds.  Pulmonary:     Effort: Pulmonary effort is normal.     Breath sounds: Normal breath sounds.  Musculoskeletal:     Cervical back: Normal range of motion and neck supple.  Neurological:     Mental Status: She is alert and oriented to person, place, and time. Mental status is at baseline.      UC Treatments / Results  Labs (all labs ordered are listed, but only abnormal  results are displayed) Labs Reviewed - No data to display  EKG   Radiology No results found.  Procedures Procedures (including critical care time)  Medications Ordered in UC Medications - No data to display  Initial Impression / Assessment and Plan / UC Course  I have reviewed the triage vital signs and the nursing notes.  Pertinent labs & imaging results that were available during my care of the patient were reviewed by me and considered in my medical decision making (see chart for details).  Viral illness, mild intermittent asthma with acute exacerbation  Patient is in no signs of distress nor toxic appearing.  Vital signs are stable.  Low suspicion for pneumonia, pneumothorax or bronchitis and therefore will defer imaging.  COVID flu and strep test negative, discussed findings.  Viral upper respiratory infection with asthmatic flare cause of symptoms, discussed this with patient, prescribed prednisone  albuterol  and Tessalon . May use additional over-the-counter medications as needed for supportive care.  May follow-up with urgent care as needed if symptoms persist or worsen.  Note given.   Final Clinical Impressions(s) / UC Diagnoses   Final diagnoses:  None   Discharge Instructions   None    ED Prescriptions   None    PDMP not reviewed this encounter.   Teresa Shelba SAUNDERS, NP 01/10/24 [phone]

## 2024-01-10 NOTE — Discharge Instructions (Signed)
 Your symptoms today are most likely being caused by a virus and should steadily improve in time it can take up to 7 to 10 days before you truly start to see a turnaround however things will get better  COVID flu and strep testing negative  Begin prednisone  every morning with food to reduce inflammation making it easier for you to breathe, should open and relax the airway, continue use of inhaler additionally  You may use Tessalon  pill every 8 hours as needed for cough    You can take Tylenol and/or Ibuprofen  as needed for fever reduction and pain relief.   For cough: honey 1/2 to 1 teaspoon (you can dilute the honey in water or another fluid).  You can also use guaifenesin and dextromethorphan for cough. You can use a humidifier for chest congestion and cough.  If you don't have a humidifier, you can sit in the bathroom with the hot shower running.      For sore throat: try warm salt water gargles, cepacol lozenges, throat spray, warm tea or water with lemon/honey, popsicles or ice, or OTC cold relief medicine for throat discomfort.   For congestion: take a daily anti-histamine like Zyrtec , Claritin, and a oral decongestant, such as pseudoephedrine.  You can also use Flonase  1-2 sprays in each nostril daily.   It is important to stay hydrated: drink plenty of fluids (water, gatorade/powerade/pedialyte, juices, or teas) to keep your throat moisturized and help further relieve irritation/discomfort.

## 2024-01-10 NOTE — ED Triage Notes (Addendum)
 Patient complains  of cough, chills, wheezing, SOB, nasal congestion with green mucus and sore throat that started x 4 days ago.  Rates pain 7/10. Patient has been taking Advil , mucinex, nasal spray, albuterol  and neb treatments. Last neb treatment was at 10:00 pm last night. Last use of Albuterol  inhaler was about 11:30 am today.

## 2024-02-28 ENCOUNTER — Telehealth: Payer: Self-pay | Admitting: Podiatry

## 2024-02-28 NOTE — Telephone Encounter (Signed)
 [facility] in [facility] called patient left message on voice mail for them to call and schedule an appointment to [facility]

## 2024-03-31 ENCOUNTER — Other Ambulatory Visit
